# Patient Record
Sex: Female | Born: 1955 | Race: White | Hispanic: No | Marital: Married | Smoking: Never smoker
Health system: Southern US, Community
[De-identification: ages and names within clinical notes are randomized; demographics above are authoritative.]

## PROBLEM LIST (undated history)

## (undated) DIAGNOSIS — E785 Hyperlipidemia, unspecified: Secondary | ICD-10-CM

## (undated) DIAGNOSIS — R300 Dysuria: Secondary | ICD-10-CM

## (undated) DIAGNOSIS — R55 Syncope and collapse: Secondary | ICD-10-CM

## (undated) DIAGNOSIS — F32A Depression, unspecified: Secondary | ICD-10-CM

## (undated) DIAGNOSIS — R251 Tremor, unspecified: Secondary | ICD-10-CM

## (undated) DIAGNOSIS — F329 Major depressive disorder, single episode, unspecified: Secondary | ICD-10-CM

## (undated) DIAGNOSIS — I951 Orthostatic hypotension: Secondary | ICD-10-CM

## (undated) DIAGNOSIS — Z862 Personal history of diseases of the blood and blood-forming organs and certain disorders involving the immune mechanism: Secondary | ICD-10-CM

## (undated) DIAGNOSIS — C801 Malignant (primary) neoplasm, unspecified: Secondary | ICD-10-CM

## (undated) DIAGNOSIS — Z8619 Personal history of other infectious and parasitic diseases: Secondary | ICD-10-CM

## (undated) DIAGNOSIS — I10 Essential (primary) hypertension: Secondary | ICD-10-CM

## (undated) DIAGNOSIS — R9431 Abnormal electrocardiogram [ECG] [EKG]: Secondary | ICD-10-CM

## (undated) HISTORY — PX: MELANOMA EXCISION: SHX5266

## (undated) HISTORY — DX: Abnormal electrocardiogram (ECG) (EKG): R94.31

## (undated) HISTORY — DX: Tremor, unspecified: R25.1

## (undated) HISTORY — DX: Hyperlipidemia, unspecified: E78.5

## (undated) HISTORY — DX: Personal history of diseases of the blood and blood-forming organs and certain disorders involving the immune mechanism: Z86.2

## (undated) HISTORY — DX: Essential (primary) hypertension: I10

## (undated) HISTORY — DX: Syncope and collapse: R55

## (undated) HISTORY — DX: Depression, unspecified: F32.A

## (undated) HISTORY — DX: Dysuria: R30.0

## (undated) HISTORY — PX: OTHER SURGICAL HISTORY: SHX169

## (undated) HISTORY — PX: GALLBLADDER SURGERY: SHX652

## (undated) HISTORY — DX: Major depressive disorder, single episode, unspecified: F32.9

## (undated) HISTORY — DX: Orthostatic hypotension: I95.1

## (undated) HISTORY — PX: OVARIAN CYST REMOVAL: SHX89

## (undated) HISTORY — DX: Personal history of other infectious and parasitic diseases: Z86.19

## (undated) HISTORY — PX: COLONOSCOPY: SHX174

## (undated) HISTORY — PX: LEG SURGERY: SHX1003

---

## 2012-03-15 DIAGNOSIS — L821 Other seborrheic keratosis: Secondary | ICD-10-CM | POA: Insufficient documentation

## 2012-03-15 DIAGNOSIS — L738 Other specified follicular disorders: Secondary | ICD-10-CM | POA: Insufficient documentation

## 2012-03-15 DIAGNOSIS — D239 Other benign neoplasm of skin, unspecified: Secondary | ICD-10-CM | POA: Insufficient documentation

## 2012-03-15 DIAGNOSIS — L980 Pyogenic granuloma: Secondary | ICD-10-CM | POA: Insufficient documentation

## 2012-04-05 DIAGNOSIS — Z8781 Personal history of (healed) traumatic fracture: Secondary | ICD-10-CM | POA: Insufficient documentation

## 2012-05-18 DIAGNOSIS — M25373 Other instability, unspecified ankle: Secondary | ICD-10-CM | POA: Insufficient documentation

## 2012-05-18 DIAGNOSIS — M25673 Stiffness of unspecified ankle, not elsewhere classified: Secondary | ICD-10-CM | POA: Insufficient documentation

## 2012-05-18 DIAGNOSIS — M7750 Other enthesopathy of unspecified foot: Secondary | ICD-10-CM | POA: Insufficient documentation

## 2013-08-05 DIAGNOSIS — M722 Plantar fascial fibromatosis: Secondary | ICD-10-CM | POA: Insufficient documentation

## 2013-10-26 DIAGNOSIS — G250 Essential tremor: Secondary | ICD-10-CM | POA: Insufficient documentation

## 2013-10-26 DIAGNOSIS — E162 Hypoglycemia, unspecified: Secondary | ICD-10-CM | POA: Insufficient documentation

## 2014-09-27 DIAGNOSIS — M25572 Pain in left ankle and joints of left foot: Secondary | ICD-10-CM | POA: Insufficient documentation

## 2014-09-27 DIAGNOSIS — G8929 Other chronic pain: Secondary | ICD-10-CM | POA: Insufficient documentation

## 2015-04-07 DIAGNOSIS — R2231 Localized swelling, mass and lump, right upper limb: Secondary | ICD-10-CM | POA: Insufficient documentation

## 2015-04-07 DIAGNOSIS — W5501XA Bitten by cat, initial encounter: Secondary | ICD-10-CM | POA: Insufficient documentation

## 2015-04-07 DIAGNOSIS — S61259A Open bite of unspecified finger without damage to nail, initial encounter: Secondary | ICD-10-CM | POA: Insufficient documentation

## 2016-06-09 DIAGNOSIS — G4752 REM sleep behavior disorder: Secondary | ICD-10-CM | POA: Insufficient documentation

## 2016-06-09 DIAGNOSIS — G4733 Obstructive sleep apnea (adult) (pediatric): Secondary | ICD-10-CM | POA: Insufficient documentation

## 2016-06-09 DIAGNOSIS — G8929 Other chronic pain: Secondary | ICD-10-CM | POA: Insufficient documentation

## 2016-07-07 DIAGNOSIS — M67911 Unspecified disorder of synovium and tendon, right shoulder: Secondary | ICD-10-CM | POA: Insufficient documentation

## 2018-02-11 ENCOUNTER — Encounter: Payer: Self-pay | Admitting: Obstetrics and Gynecology

## 2018-02-11 ENCOUNTER — Ambulatory Visit (INDEPENDENT_AMBULATORY_CARE_PROVIDER_SITE_OTHER): Payer: Medicare HMO | Admitting: Obstetrics and Gynecology

## 2018-02-11 VITALS — BP 131/75 | HR 49 | Ht 65.0 in | Wt 174.4 lb

## 2018-02-11 DIAGNOSIS — N952 Postmenopausal atrophic vaginitis: Secondary | ICD-10-CM | POA: Insufficient documentation

## 2018-02-11 DIAGNOSIS — Z7689 Persons encountering health services in other specified circumstances: Secondary | ICD-10-CM | POA: Diagnosis not present

## 2018-02-11 DIAGNOSIS — Z8619 Personal history of other infectious and parasitic diseases: Secondary | ICD-10-CM

## 2018-02-11 HISTORY — DX: Personal history of other infectious and parasitic diseases: Z86.19

## 2018-02-11 MED ORDER — ESTROGENS, CONJUGATED 0.625 MG/GM VA CREA: 1.0000 | TOPICAL_CREAM | VAGINAL | 4 refills | Status: DC

## 2018-02-11 NOTE — Progress Notes (Signed)
GYNECOLOGY PROGRESS NOTE  Subjective:    Patient ID: Julie Lewis, female    DOB: 05/21/1956, 62 y.o.   MRN: [id number]  HPI  Patient is a 62 y.o. G0P0 female who presents for complaints of vaginal irritation and burning for approximately 2 weeks.  She notes that this has been ongoing for several weeks.  Initially was only burning and itching with urination, however now just feels sore, like pressure. She is not sexually active.  She denies vaginal discharge or odor.  She does report that she has a h/o HSV, but notes that this feels different from an outbreak. She tried Monistat last week which helped some for a few days but symptoms returned.   Of note, patient has relocated from [place] ~ 1 year ago, and has not had an annual exam since that time.  Notes that she would like to establish care at [facility].    Past Medical History:  Diagnosis Date  . Depression   . History of herpes genitalis 02/11/2018   Family History  Problem Relation Age of Onset  . Heart failure Mother   . Diabetes Mother   . Hypertension Father   . Hypertension Brother     Past Surgical History:  Procedure Laterality Date  . GALLBLADDER SURGERY    . OVARIAN CYST REMOVAL      Social History   Socioeconomic History  . Marital status: Married    Spouse name: Not on file  . Number of children: Not on file  . Years of education: Not on file  . Highest education level: Not on file  Social Needs  . Financial resource strain: Not on file  . Food insecurity - worry: Not on file  . Food insecurity - inability: Not on file  . Transportation needs - medical: Not on file  . Transportation needs - non-medical: Not on file  Occupational History  . Not on file  Tobacco Use  . Smoking status: Never Smoker  . Smokeless tobacco: Never Used  Substance and Sexual Activity  . Alcohol use: Yes    Comment: occas  . Drug use: No  . Sexual activity: No    Birth control/protection: Post-menopausal  Other  Topics Concern  . Not on file  Social History Narrative  . Not on file    Current Outpatient Medications on File Prior to Visit  Medication Sig Dispense Refill  . atorvastatin (LIPITOR) 40 MG tablet Take 40 mg by mouth daily.    . citalopram (CELEXA) 10 MG tablet Take 10 mg by mouth daily.    . propranolol ER (INDERAL LA) 120 MG 24 hr capsule Take 120 mg by mouth daily.    . valACYclovir (VALTREX) 500 MG tablet Take 500 mg by mouth 2 (two) times daily.     No current facility-administered medications on file prior to visit.     Allergies  Allergen Reactions  . Ivp Dye [Iodinated Diagnostic Agents]   . Wellbutrin [Bupropion] Other (See Comments)  . Penicillins Rash      Review of Systems Pertinent items noted in HPI and remainder of comprehensive ROS otherwise negative.   Objective:   Blood pressure 131/75, pulse (!) 49, height 5\' 5"  (1.651 m), weight 174 lb 6.4 oz (79.1 kg). General appearance: alert and no distress Abdomen: soft, non-tender; bowel sounds normal; no masses,  no organomegaly Pelvic: external genitalia normal, rectovaginal septum normal.  Vagina with small amount of thin white discharge, no odor.  Mild tenderness to palpation  at posterior fourchette and base of introitus. Mild atrophy noted of vaginal mucosa.  Cervix normal appearing, no lesions and no motion tenderness.  Uterus mobile, nontender, normal shape and size.  Adnexae non-palpable, nontender bilaterally.  Extremities: extremities normal, atraumatic, no cyanosis or edema Neurologic: Grossly normal    Labs:  Microscopic wet-mount exam shows negative for pathogens, normal epithelial cells.  KOH done, no pathogens.   Assessment:   Vaginal atrophy Establish care  Plan:   - Wet prep negative, no evidence of vaginitis. Diagnosis of vaginal atrophy given. Discussed management of vaginal atrophy with hormonal vs non-hormonal local therapy.  Discussed risks and benefits of both.  Patient desires to try  hormonal therapy.  Will prescribe Premarin cream to apply 2-3 times weekly. Samples given in office today.  - Patient desires to establish care.  Will f/u in 1 month for annual exam, will reassess symptoms at that time.   Rubie Maid, MD [facility]

## 2018-02-11 NOTE — Progress Notes (Signed)
Pt has burring and itching in the vaginal area for about 2x weeks. Pt tried monstatin 1 day but it did clear it up.  Pt haven't changed soaps or laundry detergent

## 2018-07-06 ENCOUNTER — Encounter: Payer: Self-pay | Admitting: Obstetrics and Gynecology

## 2018-07-06 ENCOUNTER — Ambulatory Visit: Payer: Medicare HMO | Admitting: Obstetrics and Gynecology

## 2018-07-06 VITALS — BP 99/62 | HR 56 | Ht 65.0 in | Wt 170.2 lb

## 2018-07-06 DIAGNOSIS — Z1231 Encounter for screening mammogram for malignant neoplasm of breast: Secondary | ICD-10-CM | POA: Diagnosis not present

## 2018-07-06 DIAGNOSIS — N952 Postmenopausal atrophic vaginitis: Secondary | ICD-10-CM | POA: Diagnosis not present

## 2018-07-06 NOTE — Progress Notes (Signed)
Pt stated that she is doing a lot better.

## 2018-07-06 NOTE — Progress Notes (Signed)
    GYNECOLOGY PROGRESS NOTE  Subjective:    Patient ID: Julie Lewis, female    DOB: [birth year], 62 y.o.   MRN: [id number]  HPI  Patient is a 62 y.o. G0P0000 female who presents for f/u of vaginal atrophy.  She was initiated on Premarin cream last visit (given samples 4 weeks ago). Patient notes that she used the samples for 2-3 weeks and her symptoms have greatly improved, however she was not able to fill the prescription for the cream due to cost. Notes that she is still currently asymptomatic.   Patient reports that she needs to be scheduled for an annual exam as she has not had one I several years.  The following portions of the patient's history were reviewed and updated as appropriate: allergies, current medications, past family history, past medical history, past social history, past surgical history and problem list.  Review of Systems Pertinent items noted in HPI and remainder of comprehensive ROS otherwise negative.   Objective:   Blood pressure 99/62, pulse (!) 56, height 5\' 5"  (1.651 m), weight 170 lb 3.2 oz (77.2 kg). General appearance: alert and no distress Abdomen: soft, non-tender; bowel sounds normal; no masses,  no organomegaly Pelvic: exam deferred.    Assessment:   Vaginal atrophy Need for mammogram  Plan:  1. Vaginal atrophy - patient noted improvement in symptoms with use of Premarin.  Given samples again today along with coupon card.  Patient notes that she will use if she becomes symptomatic again.  2. Patient notes need for annual gynecologic care. Will schedule to f/u in 1 month for annual exam.    Rubie Maid, MD [facility]

## 2018-08-17 ENCOUNTER — Encounter: Payer: Self-pay | Admitting: Obstetrics and Gynecology

## 2018-08-17 ENCOUNTER — Other Ambulatory Visit (HOSPITAL_COMMUNITY)
Admission: RE | Admit: 2018-08-17 | Discharge: 2018-08-17 | Disposition: A | Discharge status: Home / Self Care | Payer: Medicare HMO | Source: Ambulatory Visit | Attending: Obstetrics and Gynecology | Admitting: Obstetrics and Gynecology

## 2018-08-17 ENCOUNTER — Ambulatory Visit (INDEPENDENT_AMBULATORY_CARE_PROVIDER_SITE_OTHER): Payer: Medicare HMO | Admitting: Obstetrics and Gynecology

## 2018-08-17 VITALS — BP 120/69 | HR 56 | Ht 65.0 in | Wt 171.5 lb

## 2018-08-17 DIAGNOSIS — Z124 Encounter for screening for malignant neoplasm of cervix: Secondary | ICD-10-CM

## 2018-08-17 DIAGNOSIS — R928 Other abnormal and inconclusive findings on diagnostic imaging of breast: Secondary | ICD-10-CM

## 2018-08-17 DIAGNOSIS — N952 Postmenopausal atrophic vaginitis: Secondary | ICD-10-CM | POA: Insufficient documentation

## 2018-08-17 DIAGNOSIS — Z1151 Encounter for screening for human papillomavirus (HPV): Secondary | ICD-10-CM | POA: Diagnosis not present

## 2018-08-17 DIAGNOSIS — Z6828 Body mass index (BMI) 28.0-28.9, adult: Secondary | ICD-10-CM | POA: Insufficient documentation

## 2018-08-17 DIAGNOSIS — Z1239 Encounter for other screening for malignant neoplasm of breast: Secondary | ICD-10-CM

## 2018-08-17 DIAGNOSIS — L989 Disorder of the skin and subcutaneous tissue, unspecified: Secondary | ICD-10-CM

## 2018-08-17 DIAGNOSIS — Z01419 Encounter for gynecological examination (general) (routine) without abnormal findings: Secondary | ICD-10-CM

## 2018-08-17 DIAGNOSIS — Z1211 Encounter for screening for malignant neoplasm of colon: Secondary | ICD-10-CM

## 2018-08-17 DIAGNOSIS — E663 Overweight: Secondary | ICD-10-CM | POA: Insufficient documentation

## 2018-08-17 DIAGNOSIS — Z1231 Encounter for screening mammogram for malignant neoplasm of breast: Secondary | ICD-10-CM

## 2018-08-17 NOTE — Progress Notes (Signed)
PT is present today for her annual exam. Pt stated that she is doing well no complaints.   

## 2018-08-17 NOTE — Progress Notes (Signed)
ANNUAL PREVENTATIVE CARE GYNECOLOGY  ENCOUNTER NOTE  Subjective:       Julie Lewis is a 62 y.o. G0P0000 female here for a routine annual gynecologic exam. The patient is not sexually active. The patient is not taking hormone replacement therapy (however she does take Premarin cream locally as needed for vaginal atrophy). Patient denies post-menopausal vaginal bleeding. The patient wears seatbelts: yes. The patient participates in regular exercise: no. Has the patient ever been transfused or tattooed?: no. The patient reports that there is not domestic violence in her life.  Current complaints: 1.  Notes skin lesion near mouth x 3 months, not relieved with Valtrex, Abreva recommended by her Dermatologist but not helping.    Gynecologic History No LMP recorded. Patient is postmenopausal. Contraception: post menopausal status Last Pap: "several years ago". Results were: normal. Does report remote history of abnormal pap smears x 2 in the past, requiring laser ablation.  Last mammogram: "several years ago". Results were: normal Last Colonoscopy: Unsure of how many years ago, but has had one in the past.  Last Dexa Scan: Never had one PCP - Dr. Denita [facility] in [place])  Obstetric History OB History  Gravida Para Term Preterm AB Living  0 0 0 0 0 0  SAB TAB Ectopic Multiple Live Births  0 0 0 0 0    Past Medical History:  Diagnosis Date  . Depression   . History of herpes genitalis 02/11/2018    Family History  Problem Relation Age of Onset  . Heart failure Mother   . Diabetes Mother   . Hypertension Father   . Hypertension Brother     Past Surgical History:  Procedure Laterality Date  . finger surgica;    . GALLBLADDER SURGERY    . hemmorriod sugery    . LEG SURGERY    . OVARIAN CYST REMOVAL      Social History   Socioeconomic History  . Marital status: Married    Spouse name: Not on file  . Number of children: Not on file  .  Years of education: Not on file  . Highest education level: Not on file  Occupational History  . Not on file  Social Needs  . Financial resource strain: Not on file  . Food insecurity:    Worry: Not on file    Inability: Not on file  . Transportation needs:    Medical: Not on file    Non-medical: Not on file  Tobacco Use  . Smoking status: Never Smoker  . Smokeless tobacco: Never Used  Substance and Sexual Activity  . Alcohol use: Yes    Comment: occas  . Drug use: No  . Sexual activity: Never    Birth control/protection: Post-menopausal  Lifestyle  . Physical activity:    Days per week: Not on file    Minutes per session: Not on file  . Stress: Not on file  Relationships  . Social connections:    Talks on phone: Not on file    Gets together: Not on file    Attends religious service: Not on file    Active member of club or organization: Not on file    Attends meetings of clubs or organizations: Not on file    Relationship status: Not on file  . Intimate partner violence:    Fear of current or ex partner: Not on file    Emotionally abused: Not on file    Physically abused: Not on file  Forced sexual activity: Not on file  Other Topics Concern  . Not on file  Social History Narrative  . Not on file    Current Outpatient Medications on File Prior to Visit  Medication Sig Dispense Refill  . atorvastatin (LIPITOR) 40 MG tablet Take 40 mg by mouth daily.    . citalopram (CELEXA) 10 MG tablet Take 10 mg by mouth daily.    . Flaxseed, Linseed, (FLAX SEED OIL) 1000 MG CAPS Take by mouth.    . gabapentin (NEURONTIN) 300 MG capsule Take 300 mg by mouth 3 (three) times daily.    . primidone (MYSOLINE) 250 MG tablet Take 250 mg by mouth daily.    . propranolol ER (INDERAL LA) 120 MG 24 hr capsule Take 120 mg by mouth daily.    . valACYclovir (VALTREX) 500 MG tablet Take 500 mg by mouth 2 (two) times daily.     No current facility-administered medications on file prior to  visit.     Allergies  Allergen Reactions  . Erythromycin     Severe stomach issues   . Ivp Dye [Iodinated Diagnostic Agents]   . Wellbutrin [Bupropion] Other (See Comments)  . Penicillins Rash      Review of Systems ROS Review of Systems - General ROS: negative for - chills, fatigue, fever, hot flashes, night sweats, weight gain or weight loss Psychological ROS: negative for - anxiety, decreased libido, depression, mood swings, physical abuse or sexual abuse Ophthalmic ROS: negative for - blurry vision, eye pain or loss of vision ENT ROS: negative for - headaches, hearing change, visual changes or vocal changes Allergy and Immunology ROS: negative for - hives, itchy/watery eyes or seasonal allergies Hematological and Lymphatic ROS: negative for - bleeding problems, bruising, swollen lymph nodes or weight loss Endocrine ROS: negative for - galactorrhea, hair pattern changes, hot flashes, malaise/lethargy, mood swings, palpitations, polydipsia/polyuria, skin changes, temperature intolerance or unexpected weight changes Breast ROS: negative for - new or changing breast lumps or nipple discharge Respiratory ROS: negative for - cough or shortness of breath Cardiovascular ROS: negative for - chest pain, irregular heartbeat, palpitations or shortness of breath Gastrointestinal ROS: no abdominal pain, change in bowel habits, or black or bloody stools Genito-Urinary ROS: no dysuria, trouble voiding, or hematuria Musculoskeletal ROS: negative for - joint pain or joint stiffness Neurological ROS: negative for - bowel and bladder control changes Dermatological ROS: negative for rash and skin changes.  Positive for skin lesion under bottom lip, persistent x 3 months, often peeling, irritation, and sometimes with weepy small bumps.    Objective:   BP 120/69   Pulse (!) 56   Ht 5\' 5"  (1.651 m)   Wt 171 lb 8 oz (77.8 kg)   BMI 28.54 kg/m  CONSTITUTIONAL: Well-developed, well-nourished female  in no acute distress. Overweight PSYCHIATRIC: Normal mood and affect. Normal behavior. Normal judgment and thought content. [place]: Alert and oriented to person, place, and time. Normal muscle tone coordination. No cranial nerve deficit noted. HENT:  Normocephalic, atraumatic, External right and left ear normal. Oropharynx is clear and moist EYES: Conjunctivae and EOM are normal. Pupils are equal, round, and reactive to light. No scleral icterus.  NECK: Normal range of motion, supple, no masses.  Normal thyroid.  SKIN: Skin is warm and dry. No rash noted. Not diaphoretic. No erythema. No pallor. Area beneath bottom lip slightly raised, mildly reddened. No peeling or open sores present.  CARDIOVASCULAR: Normal heart rate noted, regular rhythm, no murmur. RESPIRATORY: Clear to  auscultation bilaterally. Effort and breath sounds normal, no problems with respiration noted. BREASTS: Symmetric in size. No masses, skin changes, nipple drainage, or lymphadenopathy. ABDOMEN: Soft, normal bowel sounds, no distention noted.  No tenderness, rebound or guarding.  BLADDER: Normal PELVIC:  Bladder no bladder distension noted  Urethra: normal appearing urethra with no masses, tenderness or lesions  Vulva: normal appearing vulva with no masses, tenderness or lesions  Vagina: normal appearing vagina with normal color and discharge, no lesions  Cervix: normal appearing cervix without discharge or lesions  Uterus: uterus is normal size, shape, consistency and nontender  Adnexa: normal adnexa in size, nontender and no masses  RV: External Exam NormaI, No Rectal Masses and Normal Sphincter tone  MUSCULOSKELETAL: Normal range of motion. No tenderness.  No cyanosis, clubbing, or edema.  2+ distal pulses. LYMPHATIC: No Axillary, Supraclavicular, or Inguinal Adenopathy.   Labs: No results found for: WBC, HGB, HCT, MCV, PLT  No results found for: CREATININE, BUN, NA, K, CL, CO2  No results found for: ALT, AST,  GGT, ALKPHOS, BILITOT  No results found for: CHOL, HDL, LDLCALC, LDLDIRECT, TRIG, CHOLHDL  No results found for: TSH  No results found for: HGBA1C   Assessment:   Annual gynecologic examination 62 y.o. Contraception: post menopausal status Overweight  Mild vaginal atrophy Skin lesion  Plan:  Pap: Pap Co Test and Not done Mammogram: Ordered Stool Guaiac Testing:  Not Ordered.   Labs: None ordered. Patient has labs done by PCP  Dexa Scan: to be performed at age 57 Routine preventative health maintenance measures emphasized: Exercise/Diet/Weight control, Alcohol/Substance use risks and Stress Management Skin lesion still present after 3 months despite several different treatments. Advised patient to follow back up with Dermatologist.  Mild vaginal atrophy, continue Premarin cream as needed   Rubie Maid, MD [facility] 08/17/2018 9:37 PM  Return to [place]

## 2018-08-18 LAB — CYTOLOGY - PAP
Diagnosis: NEGATIVE
HPV (WINDOPATH): NOT DETECTED

## 2018-09-07 ENCOUNTER — Ambulatory Visit
Admission: RE | Admit: 2018-09-07 | Discharge: 2018-09-07 | Disposition: A | Discharge status: Home / Self Care | Payer: Medicare HMO | Source: Ambulatory Visit | Attending: Obstetrics and Gynecology | Admitting: Obstetrics and Gynecology

## 2018-09-07 DIAGNOSIS — Z1231 Encounter for screening mammogram for malignant neoplasm of breast: Secondary | ICD-10-CM | POA: Insufficient documentation

## 2018-09-07 DIAGNOSIS — Z1239 Encounter for other screening for malignant neoplasm of breast: Secondary | ICD-10-CM

## 2018-09-07 HISTORY — DX: Malignant (primary) neoplasm, unspecified: C80.1

## 2018-09-13 ENCOUNTER — Other Ambulatory Visit: Payer: Self-pay | Admitting: *Deleted

## 2018-09-13 ENCOUNTER — Inpatient Hospital Stay
Admission: RE | Admit: 2018-09-13 | Discharge: 2018-09-13 | Disposition: A | Discharge status: Home / Self Care | Payer: Self-pay | Source: Ambulatory Visit | Attending: *Deleted | Admitting: *Deleted

## 2018-09-13 DIAGNOSIS — Z9289 Personal history of other medical treatment: Secondary | ICD-10-CM

## 2018-09-14 ENCOUNTER — Other Ambulatory Visit: Payer: Self-pay | Admitting: Obstetrics and Gynecology

## 2018-09-14 DIAGNOSIS — N6489 Other specified disorders of breast: Secondary | ICD-10-CM

## 2018-09-14 DIAGNOSIS — R928 Other abnormal and inconclusive findings on diagnostic imaging of breast: Secondary | ICD-10-CM

## 2018-09-16 NOTE — Addendum Note (Signed)
Addended by: Augusto Gamble on: 09/16/2018 11:59 AM   Modules accepted: Orders

## 2018-09-23 ENCOUNTER — Ambulatory Visit
Admission: RE | Admit: 2018-09-23 | Discharge: 2018-09-23 | Disposition: A | Discharge status: Home / Self Care | Payer: Medicare HMO | Source: Ambulatory Visit | Attending: Obstetrics and Gynecology | Admitting: Obstetrics and Gynecology

## 2018-09-23 DIAGNOSIS — R928 Other abnormal and inconclusive findings on diagnostic imaging of breast: Secondary | ICD-10-CM

## 2018-09-23 DIAGNOSIS — N6489 Other specified disorders of breast: Secondary | ICD-10-CM | POA: Diagnosis present

## 2018-12-07 IMAGING — MG MM DIGITAL DIAGNOSTIC UNILAT*R* W/ TOMO W/ CAD
4 series · 4 of 12 positions shown · non-contrast
Comparison: Previous exam(s).

CLINICAL DATA: Patient recalled from screening for possible right
breast asymmetry.

EXAM:
DIGITAL DIAGNOSTIC UNILATERAL RIGHT MAMMOGRAM WITH CAD AND TOMO

[R ML synth-2D]
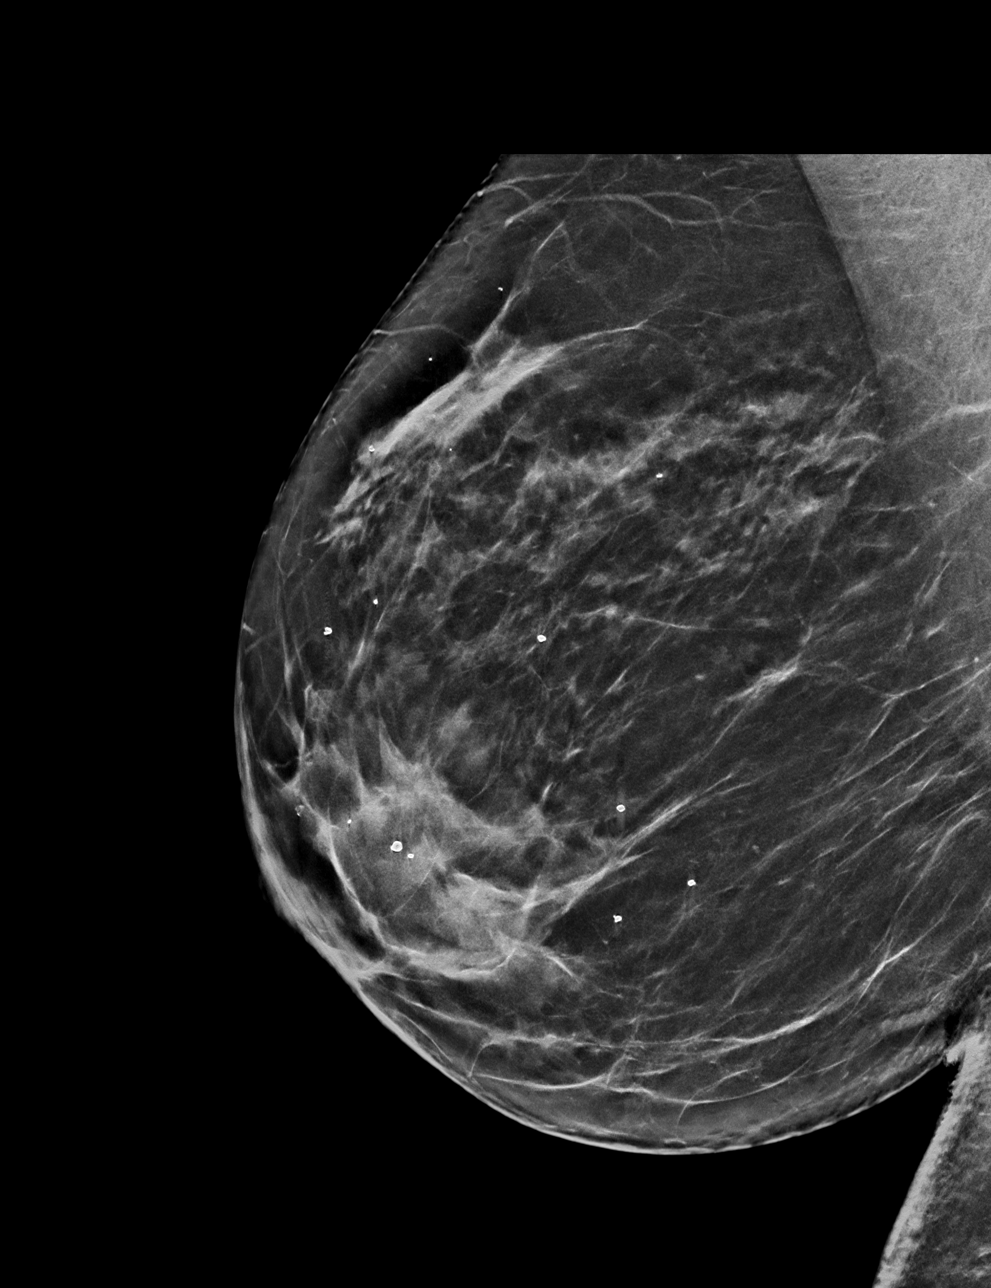

[R CC synth-2D]
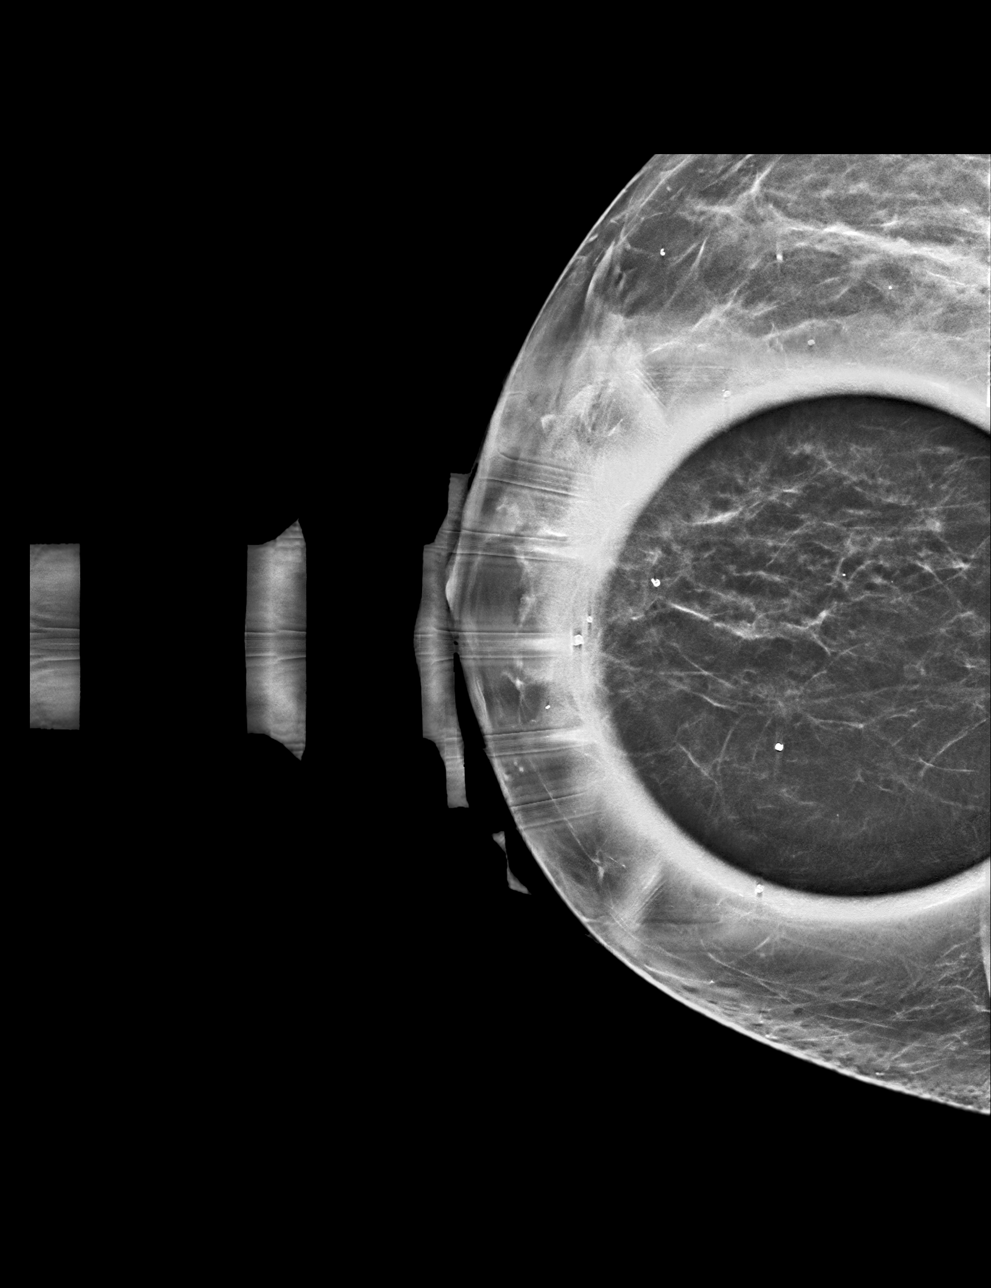

[R CC tomo · tomo slice 27/53.0]
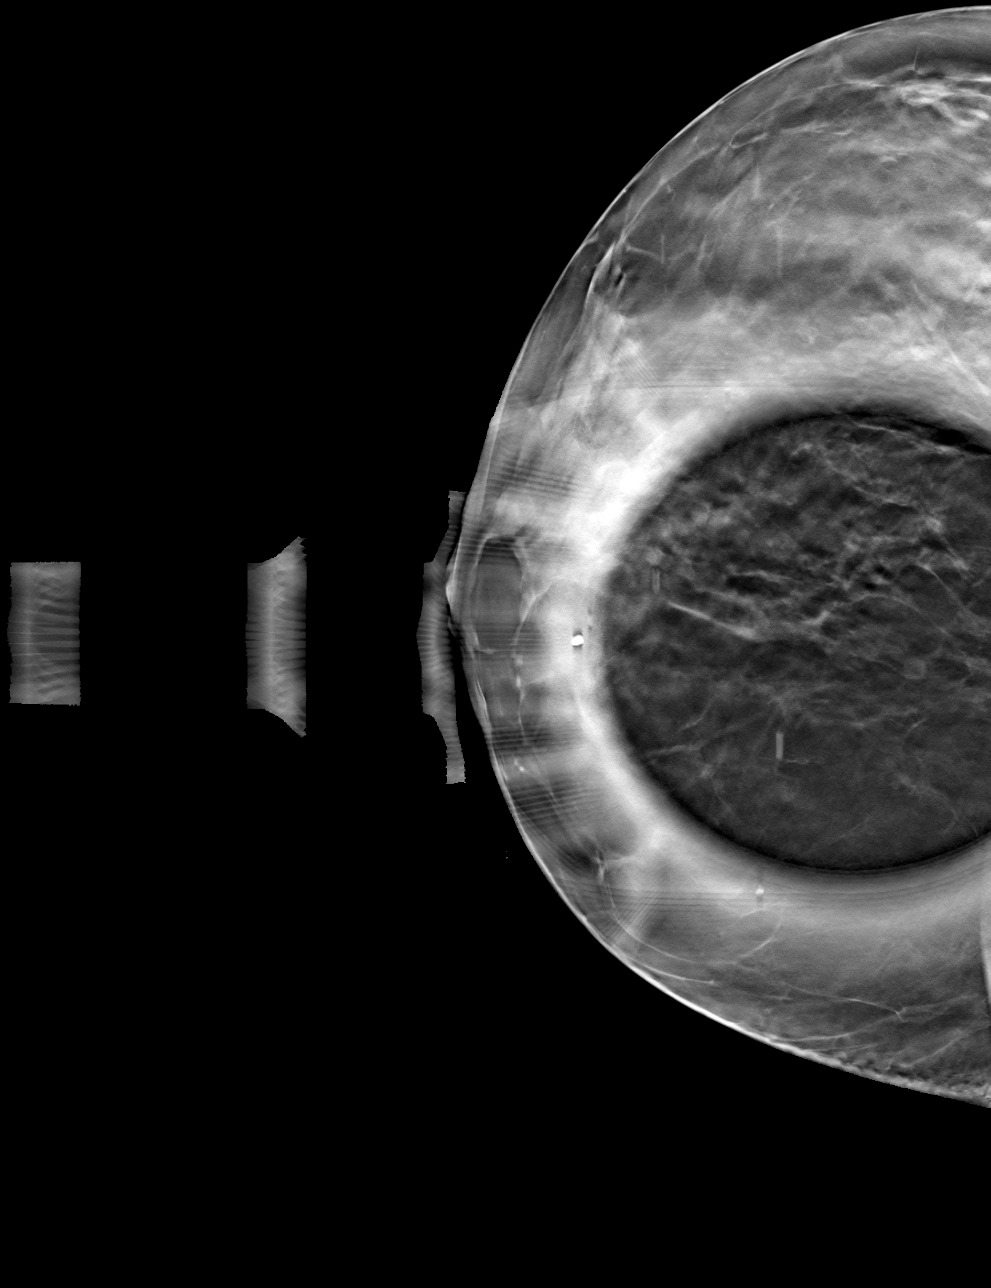

[R ML tomo · tomo slice 33/65.0]
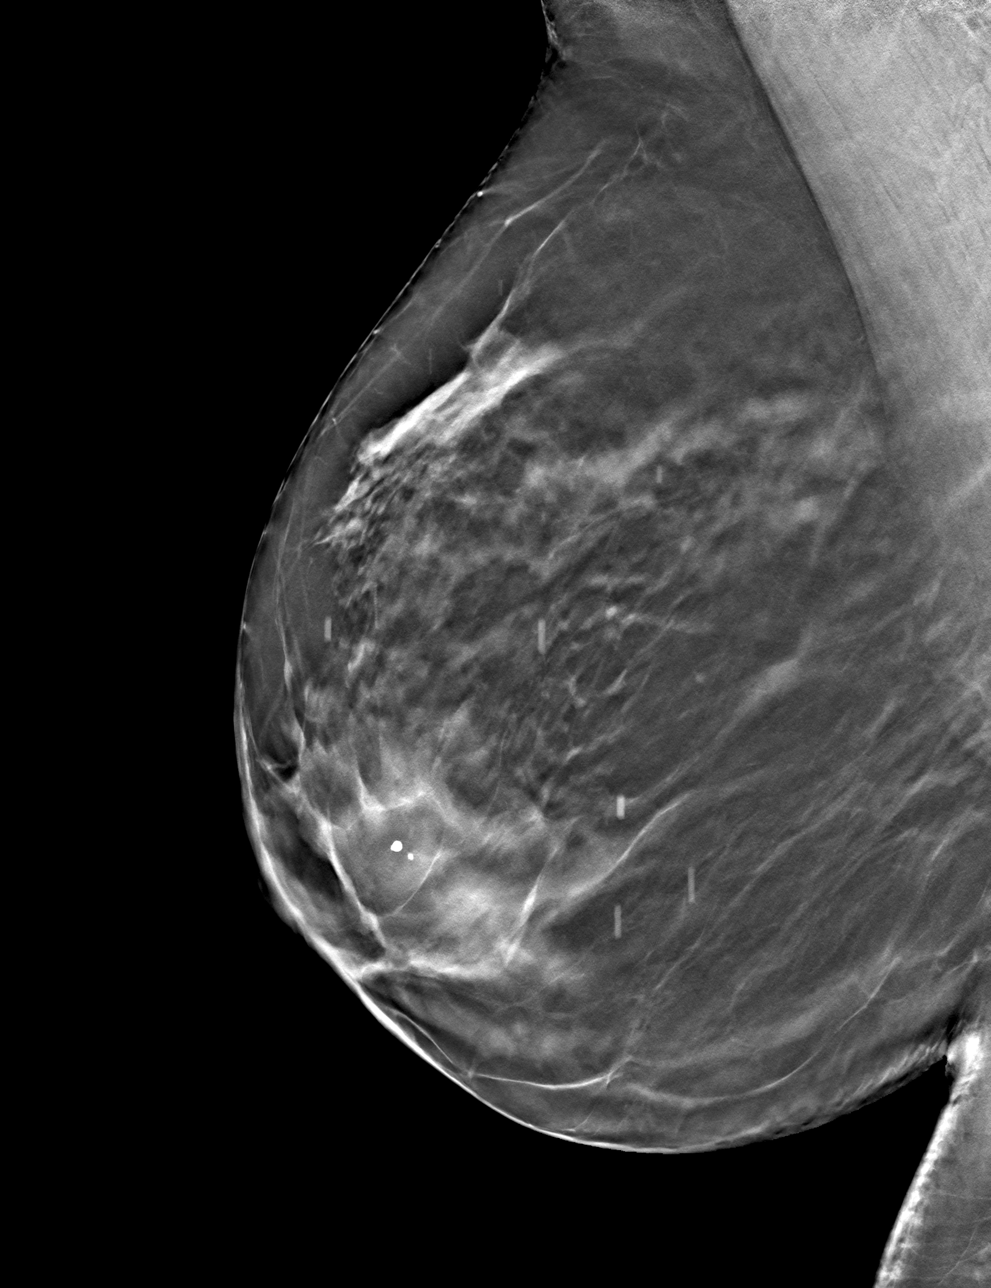

[4 of 12 positions shown; findings below may reference images not displayed]

ACR Breast Density Category c: The breast tissue is heterogeneously
dense, which may obscure small masses.
FINDINGS: Spot compression CC and full paddle true lateral views of the right
breast were obtained. Questioned asymmetry within the anteromedial
right breast demonstrated on the CC view resolved with additional
imaging, compatible with overlapping fibroglandular tissue. No
suspicious abnormality identified.

Mammographic images were processed with CAD.
IMPRESSION: No mammographic evidence for malignancy.

RECOMMENDATION:
Screening mammogram in one year.(Code:WP-5-4XS)

I have discussed the findings and recommendations with the patient.
Results were also provided in writing at the conclusion of the
visit. If applicable, a reminder letter will be sent to the patient
regarding the next appointment.

BI-RADS CATEGORY  1: Negative.

## 2019-08-10 ENCOUNTER — Other Ambulatory Visit: Payer: Self-pay | Admitting: Obstetrics and Gynecology

## 2019-08-30 ENCOUNTER — Encounter: Payer: Self-pay | Admitting: Obstetrics and Gynecology

## 2019-08-30 ENCOUNTER — Ambulatory Visit (INDEPENDENT_AMBULATORY_CARE_PROVIDER_SITE_OTHER): Payer: Medicare HMO | Admitting: Obstetrics and Gynecology

## 2019-08-30 ENCOUNTER — Other Ambulatory Visit: Payer: Self-pay

## 2019-08-30 VITALS — BP 148/85 | HR 52 | Ht 65.0 in | Wt 173.2 lb

## 2019-08-30 DIAGNOSIS — N952 Postmenopausal atrophic vaginitis: Secondary | ICD-10-CM

## 2019-08-30 DIAGNOSIS — B354 Tinea corporis: Secondary | ICD-10-CM

## 2019-08-30 DIAGNOSIS — E663 Overweight: Secondary | ICD-10-CM | POA: Diagnosis not present

## 2019-08-30 DIAGNOSIS — Z01419 Encounter for gynecological examination (general) (routine) without abnormal findings: Secondary | ICD-10-CM | POA: Diagnosis not present

## 2019-08-30 DIAGNOSIS — K5909 Other constipation: Secondary | ICD-10-CM

## 2019-08-30 DIAGNOSIS — Z1239 Encounter for other screening for malignant neoplasm of breast: Secondary | ICD-10-CM

## 2019-08-30 NOTE — Progress Notes (Signed)
ANNUAL PREVENTATIVE CARE GYNECOLOGY  ENCOUNTER NOTE  Subjective:       Julie Lewis is a 63 y.o. G0P0000 female here for a routine annual gynecologic exam. The patient is not sexually active. The patient is not taking hormone replacement therapy. Patient denies post-menopausal vaginal bleeding. The patient wears seatbelts: yes. The patient participates in regular exercise: no. Has the patient ever been transfused or tattooed?: no. The patient reports that there is not domestic violence in her life.  Current complaints: 1.  Notes issues with bowel movements, feels like she is passing "rabbit pellets".  This has been ongoing for "some time now".  2. Rash on arms has appeared over the past 2 weeks, initially on the right arm which is improving, now new one on the left. Thinks it may be ringworm or a spider bite. Has been using Lotrimin on the rash on the right arm.     Gynecologic History No LMP recorded. Patient is postmenopausal. Contraception: post menopausal status Last Pap: 08/17/2018. H/o abnormal pap x 2 in the past, requiring laser ablation.  Last mammogram: 09/23/2018.  Results are normal.  Last Colonoscopy: Unsure of date but thinks she is due in another 1-2 years.  Last Dexa Scan: Never had one PCP - Dr. Denita Lung [facility] in [place])  Obstetric History OB History  Gravida Para Term Preterm AB Living  0 0 0 0 0 0  SAB TAB Ectopic Multiple Live Births  0 0 0 0 0    Past Medical History:  Diagnosis Date  . Cancer ([place])    melanoma  . Depression   . History of herpes genitalis 02/11/2018    Family History  Problem Relation Age of Onset  . Heart failure Mother   . Diabetes Mother   . Hypertension Father   . Hypertension Brother   . Breast cancer Cousin     Past Surgical History:  Procedure Laterality Date  . finger surgica;    . GALLBLADDER SURGERY    . hemmorriod sugery    . LEG SURGERY    . OVARIAN CYST REMOVAL      Social History    Socioeconomic History  . Marital status: Married    Spouse name: Not on file  . Number of children: Not on file  . Years of education: Not on file  . Highest education level: Not on file  Occupational History  . Not on file  Social Needs  . Financial resource strain: Not on file  . Food insecurity    Worry: Not on file    Inability: Not on file  . Transportation needs    Medical: Not on file    Non-medical: Not on file  Tobacco Use  . Smoking status: Never Smoker  . Smokeless tobacco: Never Used  Substance and Sexual Activity  . Alcohol use: Yes    Comment: occas  . Drug use: No  . Sexual activity: Never    Birth control/protection: Post-menopausal  Lifestyle  . Physical activity    Days per week: Not on file    Minutes per session: Not on file  . Stress: Not on file  Relationships  . Social Herbalist on phone: Not on file    Gets together: Not on file    Attends religious service: Not on file    Active member of club or organization: Not on file    Attends meetings of clubs or organizations: Not on file  Relationship status: Not on file  . Intimate partner violence    Fear of current or ex partner: Not on file    Emotionally abused: Not on file    Physically abused: Not on file    Forced sexual activity: Not on file  Other Topics Concern  . Not on file  Social History Narrative  . Not on file    Current Outpatient Medications on File Prior to Visit  Medication Sig Dispense Refill  . atorvastatin (LIPITOR) 40 MG tablet Take 40 mg by mouth daily.    . citalopram (CELEXA) 10 MG tablet Take 10 mg by mouth daily.    . Flaxseed, Linseed, (FLAX SEED OIL) 1000 MG CAPS Take by mouth.    . gabapentin (NEURONTIN) 300 MG capsule Take 300 mg by mouth 3 (three) times daily.    . MELOXICAM PO Take by mouth.    . primidone (MYSOLINE) 250 MG tablet Take 250 mg by mouth daily.    . propranolol ER (INDERAL LA) 120 MG 24 hr capsule Take 120 mg by mouth daily.     . TRAMADOL HCL PO Take by mouth.    Marland Kitchen UNABLE TO FIND Vit B 17    . valACYclovir (VALTREX) 500 MG tablet Take 500 mg by mouth 2 (two) times daily.     No current facility-administered medications on file prior to visit.     Allergies  Allergen Reactions  . Erythromycin     Severe stomach issues   . Ivp Dye [Iodinated Diagnostic Agents]   . Wellbutrin [Bupropion] Other (See Comments)  . Penicillins Rash      Review of Systems ROS Review of Systems - General ROS: negative for - chills, fatigue, fever, hot flashes, night sweats, weight gain or weight loss Psychological ROS: negative for - anxiety, decreased libido, depression, mood swings, physical abuse or sexual abuse Ophthalmic ROS: negative for - blurry vision, eye pain or loss of vision ENT ROS: negative for - headaches, hearing change, visual changes or vocal changes Allergy and Immunology ROS: negative for - hives, itchy/watery eyes or seasonal allergies Hematological and Lymphatic ROS: negative for - bleeding problems, bruising, swollen lymph nodes or weight loss Endocrine ROS: negative for - galactorrhea, hair pattern changes, hot flashes, malaise/lethargy, mood swings, palpitations, polydipsia/polyuria, skin changes, temperature intolerance or unexpected weight changes Breast ROS: negative for - new or changing breast lumps or nipple discharge Respiratory ROS: negative for - cough or shortness of breath Cardiovascular ROS: negative for - chest pain, irregular heartbeat, palpitations or shortness of breath Gastrointestinal ROS: no abdominal pain, change in bowel habits, or black or bloody stools Genito-Urinary ROS: no dysuria, trouble voiding, or hematuria Musculoskeletal ROS: negative for - joint pain or joint stiffness Neurological ROS: negative for - bowel and bladder control changes Dermatological ROS: negative for rash and skin changes.  Positive for bilateral circular rash on forearms (see HPI).    Objective:   BP  (!) 148/85   Pulse (!) 52   Ht 5\' 5"  (1.651 m)   Wt 173 lb 3.2 oz (78.6 kg)   BMI 28.82 kg/m  CONSTITUTIONAL: Well-developed, well-nourished female in no acute distress. Overweight PSYCHIATRIC: Normal mood and affect. Normal behavior. Normal judgment and thought content. [place]: Alert and oriented to person, place, and time. Normal muscle tone coordination. No cranial nerve deficit noted. HENT:  Normocephalic, atraumatic, External right and left ear normal. Oropharynx is clear and moist EYES: Conjunctivae and EOM are normal. Pupils are equal, round, and reactive  to light. No scleral icterus.  NECK: Normal range of motion, supple, no masses.  Normal thyroid.  SKIN: Skin is warm and dry. No rash noted. Not diaphoretic. No erythema. No pallor. Circular rash on bilateral forearms, ~ 2 x 2 cm, left with scaling patch, right with few small papules.  CARDIOVASCULAR: Normal heart rate noted, regular rhythm, no murmur. RESPIRATORY: Clear to auscultation bilaterally. Effort and breath sounds normal, no problems with respiration noted. BREASTS: Symmetric in size. No masses, skin changes, nipple drainage, or lymphadenopathy. ABDOMEN: Soft, normal bowel sounds, no distention noted.  No tenderness, rebound or guarding.  BLADDER: Normal PELVIC:  Bladder no bladder distension noted  Urethra: normal appearing urethra with no masses, tenderness or lesions  Vulva: normal appearing vulva with no masses, tenderness or lesions  Vagina: normal appearing vagina with normal color and discharge, no lesions  Cervix: normal appearing cervix without discharge or lesions  Uterus: uterus is normal size, shape, consistency and nontender  Adnexa: normal adnexa in size, nontender and no masses  RV: External Exam NormaI, No Rectal Masses and Normal Sphincter tone  MUSCULOSKELETAL: Normal range of motion. No tenderness.  No cyanosis, clubbing, or edema.  2+ distal pulses. LYMPHATIC: No Axillary, Supraclavicular, or  Inguinal Adenopathy.   Labs: Performed by PCP in [place] last week.   Assessment:   Annual gynecologic examination 63 y.o. Contraception: post menopausal status Overweight  Mild vaginal atrophy Skin lesion (ringworm) Constipation HTN  Plan:  - Pap: Not done. Patient is up to date. Next pap smear in 2022. - Mammogram: Ordered - Stool Guaiac Testing:  Not Ordered.  Not due for colonoscopy until 1-2 years.  - Labs: None ordered. Patient has labs done by PCP  - Dexa Scan: to be performed at age 55 - Routine preventative health maintenance measures emphasized: Exercise/Diet/Weight control, Alcohol/Substance use risks and Stress Management  - HTN managed by PCP.  - Mild vaginal atrophy, discontinued Premarin cream, has not had any major symptoms. Can treat as needed.  For ringworm, to continue Lotrimin to new area, discussed prevention measures.  - Constipation noted, patient admits that she does not hydrate as she should. Encouraged adequate water intake, increasing fiber in diet, using prune/apple juice mix.  If still no relief can discuss medications.  - Return to clinic in 1 year for annual exam.     Rubie Maid, MD [facility] 08/30/2019 3:40 PM

## 2019-08-30 NOTE — Progress Notes (Signed)
Pt is present for annual exam. Pt stated that she is doing well no problems.  

## 2019-08-30 NOTE — Patient Instructions (Addendum)
Preventive Care 40-64 Years Old, Female Preventive care refers to visits with your health care provider and lifestyle choices that can promote health and wellness. This includes:  A yearly physical exam. This may also be called an annual well check.  Regular dental visits and eye exams.  Immunizations.  Screening for certain conditions.  Healthy lifestyle choices, such as eating a healthy diet, getting regular exercise, not using drugs or products that contain nicotine and tobacco, and limiting alcohol use. What can I expect for my preventive care visit? Physical exam Your health care provider will check your:  Height and weight. This may be used to calculate body mass index (BMI), which tells if you are at a healthy weight.  Heart rate and blood pressure.  Skin for abnormal spots. Counseling Your health care provider may ask you questions about your:  Alcohol, tobacco, and drug use.  Emotional well-being.  Home and relationship well-being.  Sexual activity.  Eating habits.  Work and work environment.  Method of birth control.  Menstrual cycle.  Pregnancy history. What immunizations do I need?  Influenza (flu) vaccine  This is recommended every year. Tetanus, diphtheria, and pertussis (Tdap) vaccine  You may need a Td booster every 10 years. Varicella (chickenpox) vaccine  You may need this if you have not been vaccinated. Zoster (shingles) vaccine  You may need this after age 60. Measles, mumps, and rubella (MMR) vaccine  You may need at least one dose of MMR if you were born in 1957 or later. You may also need a second dose. Pneumococcal conjugate (PCV13) vaccine  You may need this if you have certain conditions and were not previously vaccinated. Pneumococcal polysaccharide (PPSV23) vaccine  You may need one or two doses if you smoke cigarettes or if you have certain conditions. Meningococcal conjugate (MenACWY) vaccine  You may need this if you  have certain conditions. Hepatitis A vaccine  You may need this if you have certain conditions or if you travel or work in places where you may be exposed to hepatitis A. Hepatitis B vaccine  You may need this if you have certain conditions or if you travel or work in places where you may be exposed to hepatitis B. Haemophilus influenzae type b (Hib) vaccine  You may need this if you have certain conditions. Human papillomavirus (HPV) vaccine  If recommended by your health care provider, you may need three doses over 6 months. You may receive vaccines as individual doses or as more than one vaccine together in one shot (combination vaccines). Talk with your health care provider about the risks and benefits of combination vaccines. What tests do I need? Blood tests  Lipid and cholesterol levels. These may be checked every 5 years, or more frequently if you are over 50 years old.  Hepatitis C test.  Hepatitis B test. Screening  Lung cancer screening. You may have this screening every year starting at age 55 if you have a 30-pack-year history of smoking and currently smoke or have quit within the past 15 years.  Colorectal cancer screening. All adults should have this screening starting at age 50 and continuing until age 75. Your health care provider may recommend screening at age 45 if you are at increased risk. You will have tests every 1-10 years, depending on your results and the type of screening test.  Diabetes screening. This is done by checking your blood sugar (glucose) after you have not eaten for a while (fasting). You may have this   done every 1-3 years.  Mammogram. This may be done every 1-2 years. Talk with your health care provider about when you should start having regular mammograms. This may depend on whether you have a family history of breast cancer.  BRCA-related cancer screening. This may be done if you have a family history of breast, ovarian, tubal, or peritoneal  cancers.  Pelvic exam and Pap test. This may be done every 3 years starting at age 21. Starting at age 30, this may be done every 5 years if you have a Pap test in combination with an HPV test. Other tests  Sexually transmitted disease (STD) testing.  Bone density scan. This is done to screen for osteoporosis. You may have this scan if you are at high risk for osteoporosis. Follow these instructions at home: Eating and drinking  Eat a diet that includes fresh fruits and vegetables, whole grains, lean protein, and low-fat dairy.  Take vitamin and mineral supplements as recommended by your health care provider.  Do not drink alcohol if: ? Your health care provider tells you not to drink. ? You are pregnant, may be pregnant, or are planning to become pregnant.  If you drink alcohol: ? Limit how much you have to 0-1 drink a day. ? Be aware of how much alcohol is in your drink. In the [place] one drink equals one 12 oz bottle of beer (355 mL), one 5 oz glass of wine (148 mL), or one 1 oz glass of hard liquor (44 mL). Lifestyle  Take daily care of your teeth and gums.  Stay active. Exercise for at least 30 minutes on 5 or more days each week.  Do not use any products that contain nicotine or tobacco, such as cigarettes, e-cigarettes, and chewing tobacco. If you need help quitting, ask your health care provider.  If you are sexually active, practice safe sex. Use a condom or other form of birth control (contraception) in order to prevent pregnancy and STIs (sexually transmitted infections).  If told by your health care provider, take low-dose aspirin daily starting at age 50. What's next?  Visit your health care provider once a year for a well check visit.  Ask your health care provider how often you should have your eyes and teeth checked.  Stay up to date on all vaccines. This information is not intended to replace advice given to you by your health care provider. Make sure you  discuss any questions you have with your health care provider. Document Released: 01/04/2016 Document Revised: 08/19/2018 Document Reviewed: 08/19/2018 Elsevier Patient Education  2020 [employer] Inc. Breast Self-Awareness Breast self-awareness is knowing how your breasts look and feel. Doing breast self-awareness is important. It allows you to catch a breast problem early while it is still small and can be treated. All women should do breast self-awareness, including women who have had breast implants. Tell your doctor if you notice a change in your breasts. What you need:  A mirror.  A well-lit room. How to do a breast self-exam A breast self-exam is one way to learn what is normal for your breasts and to check for changes. To do a breast self-exam: Look for changes  1. Take off all the clothes above your waist. 2. Stand in front of a mirror in a room with good lighting. 3. Put your hands on your hips. 4. Push your hands down. 5. Look at your breasts and nipples in the mirror to see if one breast or nipple looks   different from the other. Check to see if: ? The shape of one breast is different. ? The size of one breast is different. ? There are wrinkles, dips, and bumps in one breast and not the other. 6. Look at each breast for changes in the skin, such as: ? Redness. ? Scaly areas. 7. Look for changes in your nipples, such as: ? Liquid around the nipples. ? Bleeding. ? Dimpling. ? Redness. ? A change in where the nipples are. Feel for changes  1. Lie on your back on the floor. 2. Feel each breast. To do this, follow these steps: ? Pick a breast to feel. ? Put the arm closest to that breast above your head. ? Use your other arm to feel the nipple area of your breast. Feel the area with the pads of your three middle fingers by making small circles with your fingers. For the first circle, press lightly. For the second circle, press harder. For the third circle, press even harder.  ? Keep making circles with your fingers at the different pressures as you move down your breast. Stop when you feel your ribs. ? Move your fingers a little toward the center of your body. ? Start making circles with your fingers again, this time going up until you reach your collarbone. ? Keep making up-and-down circles until you reach your armpit. Remember to keep using the three pressures. ? Feel the other breast in the same way. 3. Sit or stand in the tub or shower. 4. With soapy water on your skin, feel each breast the same way you did in step 2 when you were lying on the floor. Write down what you find Writing down what you find can help you remember what to tell your doctor. Write down:  What is normal for each breast.  Any changes you find in each breast, including: ? The kind of changes you find. ? Whether you have pain. ? Size and location of any lumps.  When you last had your menstrual period. General tips  Check your breasts every month.  If you are breastfeeding, the best time to check your breasts is after you feed your baby or after you use a breast pump.  If you get menstrual periods, the best time to check your breasts is 5-7 days after your menstrual period is over.  With time, you will become comfortable with the self-exam, and you will begin to know if there are changes in your breasts. Contact a doctor if you:  See a change in the shape or size of your breasts or nipples.  See a change in the skin of your breast or nipples, such as red or scaly skin.  Have fluid coming from your nipples that is not normal.  Find a lump or thick area that was not there before.  Have pain in your breasts.  Have any concerns about your breast health. Summary  Breast self-awareness includes looking for changes in your breasts, as well as feeling for changes within your breasts.  Breast self-awareness should be done in front of a mirror in a well-lit room.  You should  check your breasts every month. If you get menstrual periods, the best time to check your breasts is 5-7 days after your menstrual period is over.  Let your doctor know of any changes you see in your breasts, including changes in size, changes on the skin, pain or tenderness, or fluid from your nipples that is not normal. This   normal. This information is not intended to replace advice given to you by your health care provider. Make sure you discuss any questions you have with your health care provider. Document Released: 05/26/2008 Document Revised: 07/27/2018 Document Reviewed: 07/27/2018 Elsevier Patient Education  2020 [employer].    Constipation, Adult Constipation is when a person has fewer bowel movements in a week than normal, has difficulty having a bowel movement, or has stools that are dry, hard, or larger than normal. Constipation may be caused by an underlying condition. It may become worse with age if a person takes certain medicines and does not take in enough fluids. Follow these instructions at home: Eating and drinking   Eat foods that have a lot of fiber, such as fresh fruits and vegetables, whole grains, and beans.  Limit foods that are high in fat, low in fiber, or overly processed, such as french fries, hamburgers, cookies, candies, and soda.  Drink enough fluid to keep your urine clear or pale yellow. General instructions  Exercise regularly or as told by your health care provider.  Go to the restroom when you have the urge to go. Do not hold it in.  Take over-the-counter and prescription medicines only as told by your health care provider. These include any fiber supplements.  Practice pelvic floor retraining exercises, such as deep breathing while relaxing the lower abdomen and pelvic floor relaxation during bowel movements.  Watch your condition for any changes.  Keep all follow-up visits as told by your health care provider. This is important. Contact a health  care provider if:  You have pain that gets worse.  You have a fever.  You do not have a bowel movement after 4 days.  You vomit.  You are not hungry.  You lose weight.  You are bleeding from the anus.  You have thin, pencil-like stools. Get help right away if:  You have a fever and your symptoms suddenly get worse.  You leak stool or have blood in your stool.  Your abdomen is bloated.  You have severe pain in your abdomen.  You feel dizzy or you faint. This information is not intended to replace advice given to you by your health care provider. Make sure you discuss any questions you have with your health care provider. Document Released: 09/05/2004 Document Revised: 11/20/2017 Document Reviewed: 05/28/2016 Elsevier Patient Education  [place].    Cardinal Health Content in Foods  See the following list for the dietary fiber content of some common foods. High-fiber foods High-fiber foods contain 4 grams or more (4g or more) of fiber per serving. They include:  Artichoke (fresh) - 1 medium has 10.3g of fiber.  Baked beans, plain or vegetarian (canned) -  cup has 5.2g of fiber.  Blackberries or raspberries (fresh) -  cup has 4g of fiber.  Bran cereal -  cup has 8.6g of fiber.  Bulgur (cooked) -  cup has 4g of fiber.  Kidney beans (canned) -  cup has 6.8g of fiber.  Lentils (cooked) -  cup has 7.8g of fiber.  Pear (fresh) - 1 medium has 5.1g of fiber.  Peas (frozen) -  cup has 4.4g of fiber.  Pinto beans (canned) -  cup has 5.5g of fiber.  Pinto beans (dried and cooked) -  cup has 7.7g of fiber.  Potato with skin (baked) - 1 medium has 4.4g of fiber.  Quinoa (cooked) -  cup has 5g of fiber.  Soybeans (canned, frozen, or fresh) -  cup has 5.1g of fiber. Moderate-fiber foods Moderate-fiber foods contain 1-4 grams (1-4g) of fiber per serving. They include:  Almonds - 1 oz. has 3.5g of fiber.  Apple with skin - 1 medium has 3.3g of fiber.   Applesauce, sweetened -  cup has 1.5g of fiber.  Bagel, plain - one 4-inch (10-cm) bagel has 2g of fiber.  Banana - 1 medium has 3.1g of fiber.  Broccoli (cooked) -  cup has 2.5g of fiber.  Carrots (cooked) -  cup has 2.3g of fiber.  Corn (canned or frozen) -  cup has 2.1g of fiber.  Corn tortilla - one 6-inch (15-cm) tortilla has 1.5g of fiber.  Green beans (canned) -  cup has 2g of fiber.  Instant oatmeal -  cup has about 2g of fiber.  Long-grain brown rice (cooked) - 1 cup has 3.5g of fiber.  Macaroni, enriched (cooked) - 1 cup has 2.5g of fiber.  Melon - 1 cup has 1.4g of fiber.  Multigrain cereal -  cup has about 2-4g of fiber.  Orange - 1 small has 3.1g of fiber.  Potatoes, mashed -  cup has 1.6g of fiber.  Raisins - 1/4 cup has 1.6g of fiber.  Squash -  cup has 2.9g of fiber.  Sunflower seeds -  cup has 1.1g of fiber.  Tomato - 1 medium has 1.5g of fiber.  Vegetable or soy patty - 1 has 3.4g of fiber.  Whole-wheat bread - 1 slice has 2g of fiber.  Whole-wheat spaghetti -  cup has 3.2g of fiber. Low-fiber foods Low-fiber foods contain less than 1 gram (less than 1g) of fiber per serving. They include:  Egg - 1 large.  Flour tortilla - one 6-inch (15-cm) tortilla.  Fruit juice -  cup.  Lettuce - 1 cup.  Meat, poultry, or fish - 1 oz.  Milk - 1 cup.  Spinach (raw) - 1 cup.  White bread - 1 slice.  White rice -  cup.  Yogurt -  cup. Actual amounts of fiber in foods may be different depending on processing. Talk with your dietitian about how much fiber you need in your diet. This information is not intended to replace advice given to you by your health care provider. Make sure you discuss any questions you have with your health care provider. Document Released: 04/26/2007 Document Revised: 07/31/2016 Document Reviewed: 01/31/2016 Elsevier Patient Education  2020 [employer].    Body Ringworm Body ringworm is an infection of  the skin that often causes a ring-shaped rash. Body ringworm is also called tinea corporis. Body ringworm can affect any part of your skin. This condition is easily spread from person to person (is very contagious). What are the causes? This condition is caused by fungi called dermatophytes. The condition develops when these fungi grow out of control on the skin. You can get this condition if you touch a person or animal that has it. You can also get it if you share any items with an infected person or pet. These include:  Clothing, bedding, and towels.  Brushes or combs.  Gym equipment.  Any other object that has the fungus on it. What increases the risk? You are more likely to develop this condition if you:  Play sports that involve close physical contact, such as wrestling.  Sweat a lot.  Live in areas that are hot and humid.  Use public showers.  Have a weakened immune system. What are the signs or symptoms? Symptoms of this  condition include:  Itchy, raised red spots and bumps.  Red scaly patches.  A ring-shaped rash. The rash may have: ? A clear center. ? Scales or red bumps at its center. ? Redness near its borders. ? Dry and scaly skin on or around it. How is this diagnosed? This condition can usually be diagnosed with a skin exam. A skin scraping may be taken from the affected area and examined under a microscope to see if the fungus is present. How is this treated? This condition may be treated with:  An antifungal cream or ointment.  An antifungal shampoo.  Antifungal medicines. These may be prescribed if your ringworm: ? Is severe. ? Keeps coming back. ? Lasts a long time. Follow these instructions at home:  Take over-the-counter and prescription medicines only as told by your health care provider.  If you were given an antifungal cream or ointment: ? Use it as told by your health care provider. ? Wash the infected area and dry it completely before  applying the cream or ointment.  If you were given an antifungal shampoo: ? Use it as told by your health care provider. ? Leave the shampoo on your body for 3-5 minutes before rinsing.  While you have a rash: ? Wear loose clothing to stop clothes from rubbing and irritating it. ? Wash or change your bed sheets every night. ? Disinfect or throw out items that may be infected. ? Wash clothes and bed sheets in hot water. ? Wash your hands often with soap and water. If soap and water are not available, use hand sanitizer.  If your pet has the same infection, take your pet to see a veterinarian for treatment. How is this prevented?  Take a bath or shower every day and after every time you work out or play sports.  Dry your skin completely after bathing.  Wear sandals or shoes in public places and showers.  Change your clothes every day.  Wash athletic clothes after each use.  Do not share personal items with others.  Avoid touching red patches of skin on other people.  Avoid touching pets that have bald spots.  If you touch an animal that has a bald spot, wash your hands. Contact a health care provider if:  Your rash continues to spread after 7 days of treatment.  Your rash is not gone in 4 weeks.  The area around your rash gets red, warm, tender, and swollen. Summary  Body ringworm is an infection of the skin that often causes a ring-shaped rash.  This condition is easily spread from person to person (is very contagious).  This condition may be treated with antifungal cream or ointment, antifungal shampoo, or antifungal medicines.  Take over-the-counter and prescription medicines only as told by your health care provider. This information is not intended to replace advice given to you by your health care provider. Make sure you discuss any questions you have with your health care provider. Document Released: 12/05/2000 Document Revised: 08/06/2018 Document Reviewed:  08/06/2018 Elsevier Patient Education  2020 [employer].

## 2019-10-14 ENCOUNTER — Ambulatory Visit
Admission: RE | Admit: 2019-10-14 | Discharge: 2019-10-14 | Disposition: A | Discharge status: Home / Self Care | Payer: Medicare HMO | Source: Ambulatory Visit | Attending: Obstetrics and Gynecology | Admitting: Obstetrics and Gynecology

## 2019-10-14 ENCOUNTER — Other Ambulatory Visit: Payer: Self-pay

## 2019-10-14 DIAGNOSIS — Z1239 Encounter for other screening for malignant neoplasm of breast: Secondary | ICD-10-CM

## 2019-10-14 DIAGNOSIS — Z1231 Encounter for screening mammogram for malignant neoplasm of breast: Secondary | ICD-10-CM | POA: Diagnosis present

## 2019-10-14 DIAGNOSIS — R928 Other abnormal and inconclusive findings on diagnostic imaging of breast: Secondary | ICD-10-CM | POA: Insufficient documentation

## 2019-10-17 ENCOUNTER — Other Ambulatory Visit: Payer: Self-pay | Admitting: Obstetrics and Gynecology

## 2019-10-17 DIAGNOSIS — R928 Other abnormal and inconclusive findings on diagnostic imaging of breast: Secondary | ICD-10-CM

## 2019-10-18 ENCOUNTER — Telehealth: Payer: Self-pay | Admitting: Obstetrics and Gynecology

## 2019-10-18 NOTE — Telephone Encounter (Signed)
Samantha from [facility] called and stated that she corrected additional imaging orders in epic and needs Dr. Marcelline Mates to sign off on them. Aldona Bar also stated that when a patient normally needs additional imaging the provider does not need to put in orders and also our office does not need to call the patient. Please advise.

## 2019-10-18 NOTE — Telephone Encounter (Signed)
There were no orders for her when they usually place them. We just call the patient to make sure that they are aware of any abnormal results.

## 2019-10-19 NOTE — Telephone Encounter (Signed)
[facility] called spoke to [place] concerning pt's u/s. Aldona Bar stated that all orders were in and she would contact pt today to schedule an appointment.

## 2019-10-19 NOTE — Telephone Encounter (Signed)
Spoke with pt and she is aware of test results and pt has spoke with [facility] several times. Waiting on orders to be placed.

## 2019-10-28 ENCOUNTER — Ambulatory Visit
Admission: RE | Admit: 2019-10-28 | Discharge: 2019-10-28 | Disposition: A | Discharge status: Home / Self Care | Payer: Medicare HMO | Source: Ambulatory Visit | Attending: Obstetrics and Gynecology | Admitting: Obstetrics and Gynecology

## 2019-10-28 DIAGNOSIS — R928 Other abnormal and inconclusive findings on diagnostic imaging of breast: Secondary | ICD-10-CM

## 2020-01-02 ENCOUNTER — Other Ambulatory Visit (HOSPITAL_COMMUNITY): Payer: Self-pay | Admitting: Internal Medicine

## 2020-01-02 ENCOUNTER — Other Ambulatory Visit: Payer: Self-pay

## 2020-01-02 ENCOUNTER — Ambulatory Visit (HOSPITAL_COMMUNITY)
Admission: RE | Admit: 2020-01-02 | Discharge: 2020-01-02 | Disposition: A | Discharge status: Home / Self Care | Payer: Medicare Other | Source: Ambulatory Visit | Attending: Internal Medicine | Admitting: Internal Medicine

## 2020-01-02 DIAGNOSIS — M7989 Other specified soft tissue disorders: Secondary | ICD-10-CM | POA: Diagnosis not present

## 2020-01-02 DIAGNOSIS — M79604 Pain in right leg: Secondary | ICD-10-CM

## 2020-01-02 DIAGNOSIS — R936 Abnormal findings on diagnostic imaging of limbs: Secondary | ICD-10-CM | POA: Insufficient documentation

## 2020-01-02 DIAGNOSIS — R7989 Other specified abnormal findings of blood chemistry: Secondary | ICD-10-CM | POA: Diagnosis not present

## 2020-01-02 NOTE — Progress Notes (Signed)
Right lower extremity venous duplex has been completed. Preliminary results can be found in CV Proc through chart review.  Results were given to [facility] at Dr. Olin Pia office.  01/02/20 1:13 PM Julie Lewis RVT

## 2020-01-05 ENCOUNTER — Other Ambulatory Visit: Payer: Self-pay | Admitting: Internal Medicine

## 2020-01-05 DIAGNOSIS — R1013 Epigastric pain: Secondary | ICD-10-CM

## 2020-01-12 ENCOUNTER — Other Ambulatory Visit: Payer: Self-pay

## 2020-01-12 ENCOUNTER — Ambulatory Visit
Admission: RE | Admit: 2020-01-12 | Discharge: 2020-01-12 | Disposition: A | Discharge status: Home / Self Care | Payer: Medicare Other | Source: Ambulatory Visit | Attending: Internal Medicine | Admitting: Internal Medicine

## 2020-01-12 DIAGNOSIS — R1013 Epigastric pain: Secondary | ICD-10-CM | POA: Diagnosis not present

## 2020-03-23 ENCOUNTER — Ambulatory Visit: Payer: Medicare Other | Attending: Internal Medicine

## 2020-03-23 DIAGNOSIS — Z23 Encounter for immunization: Secondary | ICD-10-CM

## 2020-03-23 NOTE — Progress Notes (Addendum)
   Covid-19 Vaccination Clinic  Name:  Julie Lewis    MRN: [id number] DOB: 1956/08/13  03/23/2020  Julie Lewis was observed post Covid-19 immunization for 30 minutes based on pre-vaccination screening without incident. She was provided with Vaccine Information Sheet and instruction to access the V-Safe system. Patient complained of feeling "flushed," EMS gave patient a bottle of water and turned on fan. Patient stated she felt better and did not experience any other symptoms.  Julie Lewis was instructed to call 911 with any severe reactions post vaccine: Marland Kitchen Difficulty breathing  . Swelling of face and throat  . A fast heartbeat  . A bad rash all over body  . Dizziness and weakness   Immunizations Administered    Name Date Dose VIS Date Route   Pfizer COVID-19 Vaccine 03/23/2020 12:43 PM 0.3 mL 12/02/2019 Intramuscular   Manufacturer: [place]   Lot: [id number]   [place]: [id number]

## 2020-04-16 ENCOUNTER — Ambulatory Visit: Payer: Medicare Other | Attending: Internal Medicine

## 2020-04-16 DIAGNOSIS — Z23 Encounter for immunization: Secondary | ICD-10-CM

## 2020-04-16 NOTE — Progress Notes (Signed)
   Covid-19 Vaccination Clinic  Name:  Julie Lewis    MRN: [id number] DOB: 03-12-1956  04/16/2020  Julie Lewis was observed post Covid-19 immunization for 15 minutes without incident. She was provided with Vaccine Information Sheet and instruction to access the V-Safe system.   Julie Lewis was instructed to call 911 with any severe reactions post vaccine: Marland Kitchen Difficulty breathing  . Swelling of face and throat  . A fast heartbeat  . A bad rash all over body  . Dizziness and weakness   Immunizations Administered    Name Date Dose VIS Date Route   Pfizer COVID-19 Vaccine 04/16/2020  1:20 PM 0.3 mL 02/15/2019 Intramuscular   Manufacturer: [place]   Lot: [id number]   [place]: [id number]

## 2020-08-14 ENCOUNTER — Telehealth: Payer: Self-pay

## 2020-08-14 NOTE — Telephone Encounter (Signed)
Spoke to pt concerning receiving a notification from the nurse advise line. Pt called the nurse advise line statign that she was having pain and burning with urination and itching in the vaginal area. Pt stated that she was treated an urgent care and was given medication for yeast infection and UTI. Pt was advised that if she was still having the symptoms after a week of finishing the medication to please contact the office and schedule an appointment to be seen. Pt voiced that she understood.

## 2020-09-04 ENCOUNTER — Other Ambulatory Visit: Payer: Self-pay

## 2020-09-04 ENCOUNTER — Encounter: Payer: Self-pay | Admitting: Obstetrics and Gynecology

## 2020-09-04 ENCOUNTER — Ambulatory Visit (INDEPENDENT_AMBULATORY_CARE_PROVIDER_SITE_OTHER): Payer: Medicare HMO | Admitting: Obstetrics and Gynecology

## 2020-09-04 VITALS — BP 87/54 | HR 65 | Ht 65.0 in | Wt 162.1 lb

## 2020-09-04 DIAGNOSIS — N952 Postmenopausal atrophic vaginitis: Secondary | ICD-10-CM

## 2020-09-04 DIAGNOSIS — Z1211 Encounter for screening for malignant neoplasm of colon: Secondary | ICD-10-CM

## 2020-09-04 DIAGNOSIS — N950 Postmenopausal bleeding: Secondary | ICD-10-CM

## 2020-09-04 DIAGNOSIS — E663 Overweight: Secondary | ICD-10-CM

## 2020-09-04 DIAGNOSIS — Z1239 Encounter for other screening for malignant neoplasm of breast: Secondary | ICD-10-CM

## 2020-09-04 DIAGNOSIS — Z01419 Encounter for gynecological examination (general) (routine) without abnormal findings: Secondary | ICD-10-CM

## 2020-09-04 DIAGNOSIS — I952 Hypotension due to drugs: Secondary | ICD-10-CM

## 2020-09-04 NOTE — Progress Notes (Signed)
Pt present for annual exam. Pt stated she noticed spotting while on vacation and out of town about a month again on 08/03/2020. Pt stated she was tried at a clinic while out of town for a yeast infection and uti.

## 2020-09-04 NOTE — Patient Instructions (Signed)
Preventive Care 40-64 Years Old, Female Preventive care refers to visits with your health care provider and lifestyle choices that can promote health and wellness. This includes:  A yearly physical exam. This may also be called an annual well check.  Regular dental visits and eye exams.  Immunizations.  Screening for certain conditions.  Healthy lifestyle choices, such as eating a healthy diet, getting regular exercise, not using drugs or products that contain nicotine and tobacco, and limiting alcohol use. What can I expect for my preventive care visit? Physical exam Your health care provider will check your:  Height and weight. This may be used to calculate body mass index (BMI), which tells if you are at a healthy weight.  Heart rate and blood pressure.  Skin for abnormal spots. Counseling Your health care provider may ask you questions about your:  Alcohol, tobacco, and drug use.  Emotional well-being.  Home and relationship well-being.  Sexual activity.  Eating habits.  Work and work environment.  Method of birth control.  Menstrual cycle.  Pregnancy history. What immunizations do I need?  Influenza (flu) vaccine  This is recommended every year. Tetanus, diphtheria, and pertussis (Tdap) vaccine  You may need a Td booster every 10 years. Varicella (chickenpox) vaccine  You may need this if you have not been vaccinated. Zoster (shingles) vaccine  You may need this after age 60. Measles, mumps, and rubella (MMR) vaccine  You may need at least one dose of MMR if you were born in 1957 or later. You may also need a second dose. Pneumococcal conjugate (PCV13) vaccine  You may need this if you have certain conditions and were not previously vaccinated. Pneumococcal polysaccharide (PPSV23) vaccine  You may need one or two doses if you smoke cigarettes or if you have certain conditions. Meningococcal conjugate (MenACWY) vaccine  You may need this if you  have certain conditions. Hepatitis A vaccine  You may need this if you have certain conditions or if you travel or work in places where you may be exposed to hepatitis A. Hepatitis B vaccine  You may need this if you have certain conditions or if you travel or work in places where you may be exposed to hepatitis B. Haemophilus influenzae type b (Hib) vaccine  You may need this if you have certain conditions. Human papillomavirus (HPV) vaccine  If recommended by your health care provider, you may need three doses over 6 months. You may receive vaccines as individual doses or as more than one vaccine together in one shot (combination vaccines). Talk with your health care provider about the risks and benefits of combination vaccines. What tests do I need? Blood tests  Lipid and cholesterol levels. These may be checked every 5 years, or more frequently if you are over 50 years old.  Hepatitis C test.  Hepatitis B test. Screening  Lung cancer screening. You may have this screening every year starting at age 55 if you have a 30-pack-year history of smoking and currently smoke or have quit within the past 15 years.  Colorectal cancer screening. All adults should have this screening starting at age 50 and continuing until age 75. Your health care provider may recommend screening at age 45 if you are at increased risk. You will have tests every 1-10 years, depending on your results and the type of screening test.  Diabetes screening. This is done by checking your blood sugar (glucose) after you have not eaten for a while (fasting). You may have this   done every 1-3 years.  Mammogram. This may be done every 1-2 years. Talk with your health care provider about when you should start having regular mammograms. This may depend on whether you have a family history of breast cancer.  BRCA-related cancer screening. This may be done if you have a family history of breast, ovarian, tubal, or peritoneal  cancers.  Pelvic exam and Pap test. This may be done every 3 years starting at age 60. Starting at age 7, this may be done every 5 years if you have a Pap test in combination with an HPV test. Other tests  Sexually transmitted disease (STD) testing.  Bone density scan. This is done to screen for osteoporosis. You may have this scan if you are at high risk for osteoporosis. Follow these instructions at home: Eating and drinking  Eat a diet that includes fresh fruits and vegetables, whole grains, lean protein, and low-fat dairy.  Take vitamin and mineral supplements as recommended by your health care provider.  Do not drink alcohol if: ? Your health care provider tells you not to drink. ? You are pregnant, may be pregnant, or are planning to become pregnant.  If you drink alcohol: ? Limit how much you have to 0-1 drink a day. ? Be aware of how much alcohol is in your drink. In the [place] one drink equals one 12 oz bottle of beer (355 mL), one 5 oz glass of wine (148 mL), or one 1 oz glass of hard liquor (44 mL). Lifestyle  Take daily care of your teeth and gums.  Stay active. Exercise for at least 30 minutes on 5 or more days each week.  Do not use any products that contain nicotine or tobacco, such as cigarettes, e-cigarettes, and chewing tobacco. If you need help quitting, ask your health care provider.  If you are sexually active, practice safe sex. Use a condom or other form of birth control (contraception) in order to prevent pregnancy and STIs (sexually transmitted infections).  If told by your health care provider, take low-dose aspirin daily starting at age 48. What's next?  Visit your health care provider once a year for a well check visit.  Ask your health care provider how often you should have your eyes and teeth checked.  Stay up to date on all vaccines. This information is not intended to replace advice given to you by your health care provider. Make sure you  discuss any questions you have with your health care provider. Document Revised: 08/19/2018 Document Reviewed: 08/19/2018 Elsevier Patient Education  2020 [place] Breast self-awareness is knowing how your breasts look and feel. Doing breast self-awareness is important. It allows you to catch a breast problem early while it is still small and can be treated. All women should do breast self-awareness, including women who have had breast implants. Tell your doctor if you notice a change in your breasts. What you need:  A mirror.  A well-lit room. How to do a breast self-exam A breast self-exam is one way to learn what is normal for your breasts and to check for changes. To do a breast self-exam: Look for changes  1. Take off all the clothes above your waist. 2. Stand in front of a mirror in a room with good lighting. 3. Put your hands on your hips. 4. Push your hands down. 5. Look at your breasts and nipples in the mirror to see if one breast or nipple looks different from the  other. Check to see if: ? The shape of one breast is different. ? The size of one breast is different. ? There are wrinkles, dips, and bumps in one breast and not the other. 6. Look at each breast for changes in the skin, such as: ? Redness. ? Scaly areas. 7. Look for changes in your nipples, such as: ? Liquid around the nipples. ? Bleeding. ? Dimpling. ? Redness. ? A change in where the nipples are. Feel for changes  1. Lie on your back on the floor. 2. Feel each breast. To do this, follow these steps: ? Pick a breast to feel. ? Put the arm closest to that breast above your head. ? Use your other arm to feel the nipple area of your breast. Feel the area with the pads of your three middle fingers by making small circles with your fingers. For the first circle, press lightly. For the second circle, press harder. For the third circle, press even harder. ? Keep making circles with  your fingers at the different pressures as you move down your breast. Stop when you feel your ribs. ? Move your fingers a little toward the center of your body. ? Start making circles with your fingers again, this time going up until you reach your collarbone. ? Keep making up-and-down circles until you reach your armpit. Remember to keep using the three pressures. ? Feel the other breast in the same way. 3. Sit or stand in the tub or shower. 4. With soapy water on your skin, feel each breast the same way you did in step 2 when you were lying on the floor. Write down what you find Writing down what you find can help you remember what to tell your doctor. Write down:  What is normal for each breast.  Any changes you find in each breast, including: ? The kind of changes you find. ? Whether you have pain. ? Size and location of any lumps.  When you last had your menstrual period. General tips  Check your breasts every month.  If you are breastfeeding, the best time to check your breasts is after you feed your baby or after you use a breast pump.  If you get menstrual periods, the best time to check your breasts is 5-7 days after your menstrual period is over.  With time, you will become comfortable with the self-exam, and you will begin to know if there are changes in your breasts. Contact a doctor if you:  See a change in the shape or size of your breasts or nipples.  See a change in the skin of your breast or nipples, such as red or scaly skin.  Have fluid coming from your nipples that is not normal.  Find a lump or thick area that was not there before.  Have pain in your breasts.  Have any concerns about your breast health. Summary  Breast self-awareness includes looking for changes in your breasts, as well as feeling for changes within your breasts.  Breast self-awareness should be done in front of a mirror in a well-lit room.  You should check your breasts every month.  If you get menstrual periods, the best time to check your breasts is 5-7 days after your menstrual period is over.  Let your doctor know of any changes you see in your breasts, including changes in size, changes on the skin, pain or tenderness, or fluid from your nipples that is not normal. This information is not  intended to replace advice given to you by your health care provider. Make sure you discuss any questions you have with your health care provider. Document Revised: 07/27/2018 Document Reviewed: 07/27/2018 Elsevier Patient Education  [place].

## 2020-09-04 NOTE — Progress Notes (Signed)
ANNUAL PREVENTATIVE CARE GYNECOLOGY  ENCOUNTER NOTE  Subjective:       Julie Lewis is a 64 y.o. G0P0000 postmenopausal female here for a routine annual gynecologic exam. The patient is not sexually active. The patient is not taking hormone replacement therapy. Patient reports post-menopausal vaginal bleeding x 1 episode (see below). The patient wears seatbelts: yes. The patient participates in regular exercise: no. Has the patient ever been transfused or tattooed?: no. The patient reports that there is not domestic violence in her life.  Current complaints: 1.  Patient reports that she was away on vacation in August (08/03/2020) and had an episode of vaginal spotting.  She reports that she went to the urgent care and was treated for a UTI and also given yeast infection prophylactically.  She notes that she has not had any further episodes since then, however just wanted to inform provider. 2.  Patient also complains of sometimes feeling dizzy or woozy especially when changing positions.  She has noticed that her blood pressures have been lower than normal lately.  Thinks it may be due to one of her medications. 3.  Lastly, patient desires to reported an episode of diarrhea that lasted for approximately 10 days.  This occurred several weeks ago.  Denied any nausea or vomiting.  Notes she is no longer having this problem.   Gynecologic History Patient's last menstrual period was 08/03/2020. Contraception: post menopausal status Last Pap: 08/17/2018. H/o abnormal pap x 2 in the past, requiring laser ablation.  Last mammogram: 10/14/2019.  Results are normal.  Last Colonoscopy: Unsure of date but thinks she is due in another 1-2 years.  Last Dexa Scan: Never had one PCP - Dr. Denita Lung [facility] [place])  Obstetric History OB History  Gravida Para Term Preterm AB Living  0 0 0 0 0 0  SAB TAB Ectopic Multiple Live Births  0 0 0 0 0    Past Medical History:    Diagnosis Date  . Cancer ([place])    melanoma  . Depression   . History of herpes genitalis 02/11/2018    Family History  Problem Relation Age of Onset  . Heart failure Mother   . Diabetes Mother   . Hypertension Father   . Hypertension Brother   . Breast cancer Cousin     Past Surgical History:  Procedure Laterality Date  . finger surgica;    . GALLBLADDER SURGERY    . hemmorriod sugery    . LEG SURGERY    . OVARIAN CYST REMOVAL      Social History   Socioeconomic History  . Marital status: Married    Spouse name: Not on file  . Number of children: Not on file  . Years of education: Not on file  . Highest education level: Not on file  Occupational History  . Not on file  Tobacco Use  . Smoking status: Never Smoker  . Smokeless tobacco: Never Used  Vaping Use  . Vaping Use: Never used  Substance and Sexual Activity  . Alcohol use: Yes    Comment: occas  . Drug use: No  . Sexual activity: Never    Birth control/protection: Post-menopausal  Other Topics Concern  . Not on file  Social History Narrative  . Not on file   Social Determinants of Health   Financial Resource Strain:   . Difficulty of Paying Living Expenses: Not on file  Food Insecurity:   . Worried About [employer] of  Food in the Last Year: Not on file  . Ran Out of Food in the Last Year: Not on file  Transportation Needs:   . Lack of Transportation (Medical): Not on file  . Lack of Transportation (Non-Medical): Not on file  Physical Activity:   . Days of Exercise per Week: Not on file  . Minutes of Exercise per Session: Not on file  Stress:   . Feeling of Stress : Not on file  Social Connections:   . Frequency of Communication with Friends and Family: Not on file  . Frequency of Social Gatherings with Friends and Family: Not on file  . Attends Religious Services: Not on file  . Active Member of Clubs or Organizations: Not on file  . Attends Archivist Meetings: Not on file  .  Marital Status: Not on file  Intimate Partner Violence:   . Fear of Current or Ex-Partner: Not on file  . Emotionally Abused: Not on file  . Physically Abused: Not on file  . Sexually Abused: Not on file    Current Outpatient Medications on File Prior to Visit  Medication Sig Dispense Refill  . atorvastatin (LIPITOR) 40 MG tablet Take 40 mg by mouth daily.    . citalopram (CELEXA) 10 MG tablet Take 10 mg by mouth daily.    . Flaxseed, Linseed, (FLAX SEED OIL) 1000 MG CAPS Take by mouth.    . gabapentin (NEURONTIN) 300 MG capsule Take 300 mg by mouth 3 (three) times daily.    . MELOXICAM PO Take by mouth.    . primidone (MYSOLINE) 250 MG tablet Take 250 mg by mouth daily.    . propranolol ER (INDERAL LA) 120 MG 24 hr capsule Take 120 mg by mouth daily.    . TRAMADOL HCL PO Take by mouth.    Marland Kitchen UNABLE TO FIND Vit B 17    . valACYclovir (VALTREX) 500 MG tablet Take 500 mg by mouth 2 (two) times daily.     No current facility-administered medications on file prior to visit.    Allergies  Allergen Reactions  . Erythromycin     Severe stomach issues   . Ivp Dye [Iodinated Diagnostic Agents]   . Wellbutrin [Bupropion] Other (See Comments)  . Penicillins Rash      Review of Systems ROS Review of Systems - General ROS: negative for - chills, fatigue, fever, hot flashes, night sweats, weight gain or weight loss Psychological ROS: negative for - anxiety, decreased libido, depression, mood swings, physical abuse or sexual abuse Ophthalmic ROS: negative for - blurry vision, eye pain or loss of vision ENT ROS: negative for - headaches, hearing change, visual changes or vocal changes Allergy and Immunology ROS: negative for - hives, itchy/watery eyes or seasonal allergies Hematological and Lymphatic ROS: negative for - bleeding problems, bruising, swollen lymph nodes or weight loss Endocrine ROS: negative for - galactorrhea, hair pattern changes, hot flashes, malaise/lethargy, mood  swings, palpitations, polydipsia/polyuria, skin changes, temperature intolerance or unexpected weight changes Breast ROS: negative for - new or changing breast lumps or nipple discharge Respiratory ROS: negative for - cough or shortness of breath Cardiovascular ROS: negative for - chest pain, irregular heartbeat, palpitations or shortness of breath Gastrointestinal ROS: no abdominal pain, change in bowel habits, or black or bloody stools Genito-Urinary ROS: no dysuria, trouble voiding, or hematuria Musculoskeletal ROS: negative for - joint pain or joint stiffness Neurological ROS: negative for - bowel and bladder control changes. Positive for intermittent dizziness (see HPI)  Dermatological ROS: negative for rash and skin changes.    Objective:   BP (!) 87/54   Pulse 65   Ht 5\' 5"  (1.651 m)   Wt 162 lb 1.6 oz (73.5 kg)   LMP 08/03/2020   BMI 26.97 kg/m  CONSTITUTIONAL: Well-developed, well-nourished female in no acute distress. Overweight PSYCHIATRIC: Normal mood and affect. Normal behavior. Normal judgment and thought content. [place]: Alert and oriented to person, place, and time. Normal muscle tone coordination. No cranial nerve deficit noted. HENT:  Normocephalic, atraumatic, External right and left ear normal. Oropharynx is clear and moist EYES: Conjunctivae and EOM are normal. Pupils are equal, round, and reactive to light. No scleral icterus.  NECK: Normal range of motion, supple, no masses.  Normal thyroid.  SKIN: Skin is warm and dry. No rash noted. Not diaphoretic. No erythema. No pallor.  CARDIOVASCULAR: Normal heart rate noted, regular rhythm, no murmur. RESPIRATORY: Clear to auscultation bilaterally. Effort and breath sounds normal, no problems with respiration noted. BREASTS: Symmetric in size. No masses, skin changes, nipple drainage, or lymphadenopathy. ABDOMEN: Soft, normal bowel sounds, no distention noted.  No tenderness, rebound or guarding.  BLADDER:  Normal PELVIC:  Bladder no bladder distension noted  Urethra: normal appearing urethra with no masses, tenderness or lesions  Vulva: normal appearing vulva with no masses, tenderness or lesions  Vagina: normal appearing vagina with mild vaginal atrophy, no discharge, no lesions  Cervix: normal appearing cervix without discharge or lesions  Uterus: uterus is normal size, shape, consistency and nontender  Adnexa: normal adnexa in size, nontender and no masses  RV: External Exam NormaI, No Rectal Masses and Normal Sphincter tone  MUSCULOSKELETAL: Normal range of motion. No tenderness.  No cyanosis, clubbing, or edema.  2+ distal pulses. LYMPHATIC: No Axillary, Supraclavicular, or Inguinal Adenopathy.   Labs: No new labs  Assessment:   Annual gynecologic examination 64 y.o. Contraception: post menopausal status Overweight  Mild vaginal atrophy Hypotension Postmenopausal bleeding  Plan:  - Pap: Not done. Patient is up to date. Next pap smear in 2022. - Mammogram: Ordered - Stool Guaiac Testing:  Not Ordered.  Review of Care Everywhere notes last colonoscopy was performed in 2007.  Patient is actually overdue.  Will order referral to GI.  - Labs: None ordered. Patient routinely has labs done by PCP  - Dexa Scan: to be performed at age 69.  Will order next year. - Routine preventative health maintenance measures emphasized: Exercise/Diet/Weight control, Alcohol/Substance use risks and Stress Management  -  Patient currently with hypotension, thinks it may be due to one of her medications (propranolol, use for tremors) as it was just released increased not long ago.  Advised the patient to discuss with her neurologist on changing dose or changing medication.  Patient notes she has an appointment within the next week.  Patient did also have an episode of diarrhea several weeks ago which could lead to some possible dehydration which could also affect her blood pressures however she reports  that the diarrhea was more of an increase in stool episodes, was not completely watery. - Mild vaginal atrophy, discontinued Premarin cream last year, no issues.  -Patient with one episode of vaginal spotting today, however based on records from her August visit to urgent care URI.  Limited no further work-up indicated at this time.  However, if patient continues to have episodes of postmenopausal bleeding you will need to return for further evaluation. 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 - Patient notes that  she will hold on getting flu vaccine.  - Return to clinic in 1 year for annual exam.     Rubie Maid, MD [facility]

## 2020-09-25 ENCOUNTER — Telehealth: Payer: Self-pay

## 2020-09-25 NOTE — Telephone Encounter (Signed)
Returned patient's call to informed her the visit will be a telephone call for CC screening. Pt verbalized understanding.

## 2020-09-26 ENCOUNTER — Telehealth: Payer: Medicare Other

## 2020-09-26 ENCOUNTER — Telehealth: Payer: Self-pay

## 2020-09-26 NOTE — Telephone Encounter (Signed)
Patient was scheduled for a telephone nurse visit at 2pm today.  I attempted to contact her at 2:04pm.  LVM for her to call the office to reschedule her appt since she missed my call.  Thanks,  [place], [place]

## 2020-10-04 ENCOUNTER — Other Ambulatory Visit: Payer: Self-pay

## 2020-10-04 ENCOUNTER — Telehealth (INDEPENDENT_AMBULATORY_CARE_PROVIDER_SITE_OTHER): Payer: Medicare Other | Admitting: Gastroenterology

## 2020-10-04 DIAGNOSIS — C439 Malignant melanoma of skin, unspecified: Secondary | ICD-10-CM | POA: Insufficient documentation

## 2020-10-04 DIAGNOSIS — D239 Other benign neoplasm of skin, unspecified: Secondary | ICD-10-CM | POA: Insufficient documentation

## 2020-10-04 DIAGNOSIS — D693 Immune thrombocytopenic purpura: Secondary | ICD-10-CM | POA: Insufficient documentation

## 2020-10-04 DIAGNOSIS — Z1211 Encounter for screening for malignant neoplasm of colon: Secondary | ICD-10-CM

## 2020-10-04 MED ORDER — NA SULFATE-K SULFATE-MG SULF 17.5-3.13-1.6 GM/177ML PO SOLN: 1.0000 | Freq: Once | ORAL | 0 refills | Status: AC

## 2020-10-04 NOTE — Progress Notes (Signed)
Gastroenterology Pre-Procedure Review  Request Date: Thursday 10/18/20 Requesting Physician: Dr. Bonna Gains  PATIENT REVIEW QUESTIONS: The patient responded to the following health history questions as indicated:    1. Are you having any GI issues? Constipation 2. Do you have a personal history of Polyps? no 3. Do you have a family history of Colon Cancer or Polyps? Aunt may have had colon cancer 4. Diabetes Mellitus? no 5. Joint replacements in the past 12 months?no 6. Major health problems in the past 3 months?no 7. Any artificial heart valves, MVP, or defibrillator?no    MEDICATIONS & ALLERGIES:    Patient reports the following regarding taking any anticoagulation/antiplatelet therapy:   Plavix, Coumadin, Eliquis, Xarelto, Lovenox, Pradaxa, Brilinta, or Effient? no Aspirin? no  Patient confirms/reports the following medications:  Current Outpatient Medications  Medication Sig Dispense Refill  . atorvastatin (LIPITOR) 40 MG tablet Take 40 mg by mouth daily.    . citalopram (CELEXA) 10 MG tablet Take 10 mg by mouth daily.    . Flaxseed Oil (LINSEED OIL) OIL Use 1,000 mg every morning    . gabapentin (NEURONTIN) 300 MG capsule Take 300 mg by mouth 3 (three) times daily.    . MELOXICAM PO Take by mouth.    . primidone (MYSOLINE) 250 MG tablet Take 250 mg by mouth daily.    . propranolol ER (INDERAL LA) 120 MG 24 hr capsule Take 120 mg by mouth daily.    Marland Kitchen Resveratrol 100 MG CAPS Take 1 tablet by mouth every morning.    Marland Kitchen TRAMADOL HCL PO Take by mouth.    . valACYclovir (VALTREX) 500 MG tablet Take 500 mg by mouth 2 (two) times daily.    . Na Sulfate-K Sulfate-Mg Sulf 17.5-3.13-1.6 GM/177ML SOLN Take 1 kit by mouth once for 1 dose. 354 mL 0   No current facility-administered medications for this visit.    Patient confirms/reports the following allergies:  Allergies  Allergen Reactions  . Bupropion Other (See Comments)    myoclonus  . Erythromycin     Severe stomach issues    . Iodinated Diagnostic Agents Rash    Contrast dye  . Penicillins Rash    No orders of the defined types were placed in this encounter.   AUTHORIZATION INFORMATION Primary Insurance: 1D#: Group #:  Secondary Insurance: 1D#: Group #:  SCHEDULE INFORMATION: Date: Thursday 10/18/20 Time: Location:[facility]

## 2020-10-05 ENCOUNTER — Telehealth: Payer: Self-pay

## 2020-10-05 NOTE — Telephone Encounter (Signed)
Returned patients call. Left message for patient to call the office back to reschedule procedure.

## 2020-10-16 ENCOUNTER — Other Ambulatory Visit: Payer: Medicare Other | Attending: Gastroenterology

## 2020-10-18 ENCOUNTER — Ambulatory Visit: Admission: RE | Admit: 2020-10-18 | Payer: Medicare Other | Source: Home / Self Care | Admitting: Gastroenterology

## 2020-10-18 ENCOUNTER — Encounter: Admission: RE | Payer: Self-pay | Source: Home / Self Care

## 2020-10-18 SURGERY — COLONOSCOPY WITH PROPOFOL
Anesthesia: General

## 2020-11-14 NOTE — Telephone Encounter (Signed)
Patient ready to reschedule canceled colonoscopy procedure. Please call back to resch. Pt had screening call on 10.14.21.

## 2020-11-19 ENCOUNTER — Ambulatory Visit
Admission: RE | Admit: 2020-11-19 | Discharge: 2020-11-19 | Disposition: A | Discharge status: Home / Self Care | Payer: Medicare Other | Source: Ambulatory Visit | Attending: Obstetrics and Gynecology | Admitting: Obstetrics and Gynecology

## 2020-11-19 ENCOUNTER — Other Ambulatory Visit: Payer: Self-pay

## 2020-11-19 DIAGNOSIS — Z1239 Encounter for other screening for malignant neoplasm of breast: Secondary | ICD-10-CM

## 2020-11-19 DIAGNOSIS — Z1231 Encounter for screening mammogram for malignant neoplasm of breast: Secondary | ICD-10-CM | POA: Diagnosis not present

## 2020-11-19 DIAGNOSIS — Z1211 Encounter for screening for malignant neoplasm of colon: Secondary | ICD-10-CM

## 2020-12-07 ENCOUNTER — Other Ambulatory Visit
Admission: RE | Admit: 2020-12-07 | Discharge: 2020-12-07 | Disposition: A | Discharge status: Home / Self Care | Payer: Medicare Other | Source: Ambulatory Visit | Attending: Gastroenterology | Admitting: Gastroenterology

## 2020-12-07 ENCOUNTER — Other Ambulatory Visit: Payer: Self-pay

## 2020-12-07 DIAGNOSIS — Z20822 Contact with and (suspected) exposure to covid-19: Secondary | ICD-10-CM | POA: Insufficient documentation

## 2020-12-07 DIAGNOSIS — Z01812 Encounter for preprocedural laboratory examination: Secondary | ICD-10-CM | POA: Insufficient documentation

## 2020-12-07 LAB — SARS CORONAVIRUS 2 (TAT 6-24 HRS): SARS Coronavirus 2: NEGATIVE

## 2020-12-11 ENCOUNTER — Other Ambulatory Visit: Payer: Self-pay

## 2020-12-11 ENCOUNTER — Ambulatory Visit: Payer: Medicare Other | Admitting: Anesthesiology

## 2020-12-11 ENCOUNTER — Encounter: Payer: Self-pay | Admitting: Gastroenterology

## 2020-12-11 ENCOUNTER — Encounter
Admission: RE | Disposition: A | Discharge status: Home / Self Care | Payer: Self-pay | Source: Home / Self Care | Attending: Gastroenterology

## 2020-12-11 ENCOUNTER — Ambulatory Visit
Admission: RE | Admit: 2020-12-11 | Discharge: 2020-12-11 | Disposition: A | Discharge status: Home / Self Care | Payer: Medicare Other | Attending: Gastroenterology | Admitting: Gastroenterology

## 2020-12-11 DIAGNOSIS — Z79899 Other long term (current) drug therapy: Secondary | ICD-10-CM | POA: Insufficient documentation

## 2020-12-11 DIAGNOSIS — K648 Other hemorrhoids: Secondary | ICD-10-CM | POA: Insufficient documentation

## 2020-12-11 DIAGNOSIS — Z888 Allergy status to other drugs, medicaments and biological substances status: Secondary | ICD-10-CM | POA: Insufficient documentation

## 2020-12-11 DIAGNOSIS — Z85820 Personal history of malignant melanoma of skin: Secondary | ICD-10-CM | POA: Insufficient documentation

## 2020-12-11 DIAGNOSIS — Z1211 Encounter for screening for malignant neoplasm of colon: Secondary | ICD-10-CM | POA: Diagnosis present

## 2020-12-11 DIAGNOSIS — Z880 Allergy status to penicillin: Secondary | ICD-10-CM | POA: Diagnosis not present

## 2020-12-11 DIAGNOSIS — Z881 Allergy status to other antibiotic agents status: Secondary | ICD-10-CM | POA: Diagnosis not present

## 2020-12-11 DIAGNOSIS — Z91041 Radiographic dye allergy status: Secondary | ICD-10-CM | POA: Diagnosis not present

## 2020-12-11 HISTORY — PX: COLONOSCOPY WITH PROPOFOL: SHX5780

## 2020-12-11 SURGERY — COLONOSCOPY WITH PROPOFOL
Anesthesia: General

## 2020-12-11 MED ORDER — PROPOFOL 10 MG/ML IV BOLUS
INTRAVENOUS | Status: DC | PRN
  Administered 2020-12-11: 40 mg via INTRAVENOUS

## 2020-12-11 MED ORDER — LIDOCAINE HCL (PF) 2 % IJ SOLN
INTRAMUSCULAR | Status: AC
  Filled 2020-12-11: qty 15

## 2020-12-11 MED ORDER — PROPOFOL 500 MG/50ML IV EMUL
INTRAVENOUS | Status: DC | PRN
  Administered 2020-12-11: 140 ug/kg/min via INTRAVENOUS

## 2020-12-11 MED ORDER — SODIUM CHLORIDE 0.9 % IV SOLN: INTRAVENOUS | Status: DC

## 2020-12-11 MED ORDER — LIDOCAINE HCL (CARDIAC) PF 100 MG/5ML IV SOSY
PREFILLED_SYRINGE | INTRAVENOUS | Status: DC | PRN
  Administered 2020-12-11: 80 mg via INTRAVENOUS

## 2020-12-11 MED ORDER — PROPOFOL 500 MG/50ML IV EMUL
INTRAVENOUS | Status: AC
  Filled 2020-12-11: qty 50

## 2020-12-11 MED ORDER — GLYCOPYRROLATE 0.2 MG/ML IJ SOLN
INTRAMUSCULAR | Status: DC | PRN
  Administered 2020-12-11: .2 mg via INTRAVENOUS

## 2020-12-11 MED ORDER — PROPOFOL 10 MG/ML IV BOLUS
INTRAVENOUS | Status: AC
  Filled 2020-12-11: qty 20

## 2020-12-11 NOTE — Op Note (Signed)
[facility] Gastroenterology Patient Name: Julie Lewis Procedure Date: 12/11/2020 7:11 AM MRN: [id number] Account #: [id number] Date of Birth: 1956-01-26 Admit Type: Outpatient Age: 64 Room: [facility] ENDO ROOM 3 Gender: Female Note Status: Finalized Procedure:             Colonoscopy Indications:           Screening for colorectal malignant neoplasm Providers:             Pranika Finks B. Bonna Gains MD, MD Referring MD:          Denita Lung (Referring MD) Medicines:             Monitored Anesthesia Care Complications:         No immediate complications. Procedure:             Pre-Anesthesia Assessment:                        - Prior to the procedure, a History and Physical was                         performed, and patient medications, allergies and                         sensitivities were reviewed. The patient's tolerance                         of previous anesthesia was reviewed.                        - The risks and benefits of the procedure and the                         sedation options and risks were discussed with the                         patient. All questions were answered and informed                         consent was obtained.                        - Patient identification and proposed procedure were                         verified prior to the procedure by the physician, the                         nurse, the anesthetist and the technician. The                         procedure was verified in the pre-procedure area in                         the procedure room in the endoscopy suite.                        - ASA Grade Assessment: II - A patient with mild  systemic disease.                        - After reviewing the risks and benefits, the patient                         was deemed in satisfactory condition to undergo the                         procedure.                        After obtaining informed consent, the  colonoscope was                         passed under direct vision. Throughout the procedure,                         the patient's blood pressure, pulse, and oxygen                         saturations were monitored continuously. The                         Colonoscope was introduced through the anus and                         advanced to the the cecum, identified by appendiceal                         orifice and ileocecal valve. The colonoscopy was                         performed with ease. The patient tolerated the                         procedure well. The quality of the bowel preparation                         was good. Findings:      The perianal and digital rectal examinations were normal.      The rectum, sigmoid colon, descending colon, transverse colon, ascending       colon and cecum appeared normal.      Non-bleeding internal hemorrhoids were found during retroflexion.      No additional abnormalities were found on retroflexion. Impression:            - The rectum, sigmoid colon, descending colon,                         transverse colon, ascending colon and cecum are normal.                        - Non-bleeding internal hemorrhoids.                        - No specimens collected. Recommendation:        - Discharge patient to home.                        -  Resume previous diet.                        - Continue present medications.                        - Repeat colonoscopy in 10 years for screening                         purposes.                        - Return to primary care physician as previously                         scheduled.                        - The findings and recommendations were discussed with                         the patient.                        - The findings and recommendations were discussed with                         the patient's family.                        - In the future, if patient develops new symptoms such                          as blood per rectum, abdominal pain, weight loss,                         altered bowel habits or any other reason for concern,                         patient should discuss this with thier PCP as they may                         need a GI referral at that time or evaluation for need                         for colonoscopy earlier than the recommended screening                         colonoscopy.                        In addition, if patient's family history of colon                         cancer changes (no family history at this time) in the                         future, earlier screening may be indicated and patient  should discuss this with PCP as well.                        - High fiber diet. Procedure Code(s):     --- Professional ---                        [phone], Colonoscopy, flexible; diagnostic, including                         collection of specimen(s) by brushing or washing, when                         performed (separate procedure) Diagnosis Code(s):     --- Professional ---                        Z12.11, Encounter for screening for malignant neoplasm                         of colon CPT copyright 2019 American Medical Association. All rights reserved. The codes documented in this report are preliminary and upon coder review may  be revised to meet current compliance requirements.  Vonda Antigua, MD Margretta Sidle B. Bonna Gains MD, MD 12/11/2020 8:47:30 AM This report has been signed electronically. Number of Addenda: 0 Note Initiated On: 12/11/2020 7:11 AM Scope Withdrawal Time: 0 hours 16 minutes 27 seconds  Total Procedure Duration: 0 hours 20 minutes 35 seconds       [facility]

## 2020-12-11 NOTE — Anesthesia Preprocedure Evaluation (Signed)
Anesthesia Evaluation  Patient identified by MRN, date of birth, ID band Patient awake    Reviewed: Allergy & Precautions, H&P , NPO status , Patient's Chart, lab work & pertinent test results  Airway Mallampati: II  TM Distance: >3 FB     Dental no notable dental hx.    Pulmonary sleep apnea and Continuous Positive Airway Pressure Ventilation ,    breath sounds clear to auscultation       Cardiovascular negative cardio ROS Normal cardiovascular exam     Neuro/Psych PSYCHIATRIC DISORDERS Depression negative neurological ROS     GI/Hepatic negative GI ROS, Neg liver ROS,   Endo/Other  negative endocrine ROS  Renal/GU negative Renal ROS     Musculoskeletal negative musculoskeletal ROS (+)   Abdominal   Peds  Hematology negative hematology ROS (+)   Anesthesia Other Findings   Reproductive/Obstetrics                             Anesthesia Physical Anesthesia Plan  ASA: II  Anesthesia Plan: General   Post-op Pain Management:    Induction: Intravenous  PONV Risk Score and Plan: TIVA  Airway Management Planned: Mask  Additional Equipment:   Intra-op Plan:   Post-operative Plan:   Informed Consent: I have reviewed the patients History and Physical, chart, labs and discussed the procedure including the risks, benefits and alternatives for the proposed anesthesia with the patient or authorized representative who has indicated his/her understanding and acceptance.       Plan Discussed with: CRNA, Anesthesiologist and Surgeon  Anesthesia Plan Comments:         Anesthesia Quick Evaluation

## 2020-12-11 NOTE — Transfer of Care (Signed)
Immediate Anesthesia Transfer of Care Note  Patient: Julie Lewis  Procedure(s) Performed: COLONOSCOPY WITH PROPOFOL (N/A )  Patient Location: PACU and Endoscopy Unit  Anesthesia Type:General  Level of Consciousness: awake, alert  and oriented  Airway & Oxygen Therapy: Patient Spontanous Breathing  Post-op Assessment: Report given to RN and Post -op Vital signs reviewed and stable  Post vital signs: Reviewed and stable  Last Vitals:  Vitals Value Taken Time  BP 128/81 12/11/20 0845  Temp    Pulse 59 12/11/20 0845  Resp 16 12/11/20 0845  SpO2 100 % 12/11/20 0845  Vitals shown include unvalidated device data.  Last Pain:  Vitals:   12/11/20 0744  TempSrc: Temporal  PainSc: 0-No pain         Complications: No complications documented.

## 2020-12-11 NOTE — H&P (Signed)
Vonda Antigua, MD [address] [place], [place], [place], [address] [address], [place], [place], [place], [address] Phone: [phone]  Fax: [phone]  Primary Care Physician:  Denita Lung, DO   Pre-Procedure History & Physical: HPI:  Julie Lewis is a 64 y.o. female is here for a colonoscopy.   Past Medical History:  Diagnosis Date  . Cancer ([place])    melanoma  . Depression   . 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 Tremors of nervous system     Past Surgical History:  Procedure Laterality Date  . COLONOSCOPY    . finger surgica;    . GALLBLADDER SURGERY    . hemmorriod sugery    . LEG SURGERY    . OVARIAN CYST REMOVAL      Prior to Admission medications   Medication Sig Start Date End Date Taking? Authorizing Provider  atorvastatin (LIPITOR) 40 MG tablet Take 40 mg by mouth daily.   Yes [provider]  citalopram (CELEXA) 10 MG tablet Take 10 mg by mouth daily.   Yes [provider]  Flaxseed Oil (LINSEED OIL) OIL Use 1,000 mg every morning   Yes [provider]  gabapentin (NEURONTIN) 300 MG capsule Take 300 mg by mouth 3 (three) times daily.   Yes [provider]  MELOXICAM PO Take by mouth.   Yes [provider]  primidone (MYSOLINE) 250 MG tablet Take 250 mg by mouth daily.   Yes [provider]  propranolol ER (INDERAL LA) 120 MG 24 hr capsule Take 120 mg by mouth daily.   Yes [provider]  Resveratrol 100 MG CAPS Take 1 tablet by mouth every morning.   Yes [provider]  TRAMADOL HCL PO Take by mouth.   Yes [provider]  valACYclovir (VALTREX) 500 MG tablet Take 500 mg by mouth 2 (two) times daily.    [provider]    Allergies as of 11/19/2020 - Review Complete 10/04/2020  Allergen Reaction Noted  . Bupropion Other (See Comments) 02/11/2018  . Erythromycin  08/17/2018  . Iodinated diagnostic agents Rash 02/11/2018  . Penicillins Rash  02/11/2018    Family History  Problem Relation Age of Onset  . Heart failure Mother   . Diabetes Mother   . Hypertension Father   . Hypertension Brother   . Breast cancer Cousin     Social History   Socioeconomic History  . Marital status: Married    Spouse name: Not on file  . Number of children: Not on file  . Years of education: Not on file  . Highest education level: Not on file  Occupational History  . Not on file  Tobacco Use  . Smoking status: Never Smoker  . Smokeless tobacco: Never Used  Vaping Use  . Vaping Use: Never used  Substance and Sexual Activity  . Alcohol use: Yes    Comment: occas  . Drug use: No  . Sexual activity: Never    Birth control/protection: Post-menopausal  Other Topics Concern  . Not on file  Social History Narrative  . Not on file   Social Determinants of Health   Financial Resource Strain: Not on file  Food Insecurity: Not on file  Transportation Needs: Not on file  Physical Activity: Not on file  Stress: Not on file  Social Connections: Not on file  Intimate Partner Violence: Not on file    Review of Systems: See HPI, otherwise negative ROS  Physical Exam: BP Marland Kitchen)  153/102   Pulse (!) 53   Temp (!) 97.3 F (36.3 C) (Temporal)   Resp 18   Ht 5\' 5"  (1.651 m)   Wt 73.9 kg   LMP 08/03/2020   SpO2 99%   BMI 27.12 kg/m  General:   Alert,  pleasant and cooperative in NAD Head:  Normocephalic and atraumatic. Neck:  Supple; no masses or thyromegaly. Lungs:  Clear throughout to auscultation, normal respiratory effort.    Heart:  +S1, +S2, Regular rate and rhythm, No edema. Abdomen:  Soft, nontender and nondistended. Normal bowel sounds, without guarding, and without rebound.   Neurologic:  Alert and  oriented x4;  grossly normal neurologically.  Impression/Plan: Julie Lewis is here for a colonoscopy to be performed for average risk screening.  Risks, benefits, limitations, and alternatives regarding  colonoscopy  have been reviewed with the patient.  Questions have been answered.  All parties agreeable.   Virgel Manifold, MD  12/11/2020, 8:08 AM

## 2020-12-11 NOTE — Anesthesia Procedure Notes (Signed)
Procedure Name: MAC Date/Time: 12/11/2020 8:22 AM Performed by: Lily Peer, Zen Cedillos, CRNA Pre-anesthesia Checklist: Patient identified, Suction available, Emergency Drugs available, Patient being monitored and Timeout performed Patient Re-evaluated:Patient Re-evaluated prior to induction Oxygen Delivery Method: Nasal cannula Induction Type: IV induction

## 2020-12-11 NOTE — Anesthesia Postprocedure Evaluation (Signed)
Anesthesia Post Note  Patient: Julie Lewis  Procedure(s) Performed: COLONOSCOPY WITH PROPOFOL (N/A )  Patient location during evaluation: Phase II Anesthesia Type: General Level of consciousness: awake and alert Pain management: pain level controlled Vital Signs Assessment: post-procedure vital signs reviewed and stable Respiratory status: spontaneous breathing, nonlabored ventilation and respiratory function stable Cardiovascular status: blood pressure returned to baseline and stable Postop Assessment: no apparent nausea or vomiting Anesthetic complications: no   No complications documented.   Last Vitals:  Vitals:   12/11/20 0904 12/11/20 0914  BP: (!) 161/91 (!) 177/99  Pulse: (!) 55 (!) 53  Resp: 13 11  Temp:    SpO2: 99% 100%    Last Pain:  Vitals:   12/11/20 0914  TempSrc:   PainSc: 0-No pain                 Phill Mutter

## 2020-12-11 NOTE — Progress Notes (Signed)
   12/11/20 0740  Clinical Encounter Type  Visited With Family  Visit Type Initial  Referral From Chaplain  Consult/Referral To Chaplain  While rounding SDS waiting area, chaplain briefly spoke with Pt's husband. He was looking for Pt's reference # so he could follow where she was in the procedure. Chaplain tried to get that number, but was unable get a number, but did get information for him. She was told Pt procedure would take approximately 3 hrs and Pt's husband was satisfied with information. Chaplain also told him he would receive a call when Pt was done.

## 2020-12-12 ENCOUNTER — Encounter: Payer: Self-pay | Admitting: Gastroenterology

## 2021-01-02 DIAGNOSIS — I21A1 Myocardial infarction type 2: Secondary | ICD-10-CM | POA: Insufficient documentation

## 2021-01-02 DIAGNOSIS — I10 Essential (primary) hypertension: Secondary | ICD-10-CM | POA: Insufficient documentation

## 2021-01-08 ENCOUNTER — Telehealth: Payer: Self-pay | Admitting: *Deleted

## 2021-01-08 NOTE — Telephone Encounter (Signed)
Pt's sister Casimiro Needle) is a pt of Dr. 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 Neck/chest serve pain and was transferred to [facility] with serve neck chest pain.  Was admitted. Her Troponin levels are hsTroponin First one 131, Second one 371, Third one 226,   Delta hsTroponin:240  ... She has reached out to her doctor in [place] for  Cardiologist but has heard nothing back.  With heart attack history in our family she is desperate.  Lives in [place] and wants local doctor for treatment either [place] or [place].  Hope you can get her in for 1st appointment as your patient with a readily recommendation for available Cardiologist.  Thank you.  I had her permission to contact you she has been contacting me thru the night last nigh and every since her incident.  Hope you can help her.  Her phone is [phone], Address [phone], [place] [phone].... Much appreciation.Marland KitchenMarland KitchenEllsworth

## 2021-01-08 NOTE — Telephone Encounter (Signed)
Pt called in trying to get a new pt appt with Dr. Damita Dunnings asap to discuss getting a cardiology referral ASAP. Per Dr. Damita Dunnings a cardiology referral should be pending and just schedule new pt appt when able. Pt advise note will be routed to our [facility] and they will call her to discuss cardiology referral and new pt appt scheduled with Dr. Damita Dunnings on 01/24/21  FYI to PCP and also routed to our PCCs so they can f/u with pt regarding cardiology referral per Dr. Trevor Iha with Elige Ko., RN and she is aware of situation so phone note sent to her also

## 2021-01-09 NOTE — Telephone Encounter (Signed)
It appears that the main issue is getting her set up with cardiology.  Since there has already been contact with the primary doc about setting up a cardiologist closer to home, I think it makes sense to let them proceed with that.  That way all of her old records can get packaged and sent to cardiology per the usual routine.  I think that is the best option for now and I will await to hear that outcome.  I still think it makes sense to schedule her as a new patient when possible but I did not put in a new referral yet.  Thanks.

## 2021-01-09 NOTE — Telephone Encounter (Signed)
Spoke with patient and advised her that she has an appointment scheduled with Cardiologist at [facility] in February. Patient stated she wanted to see someone locally [place] or [place]. She saw her current PCP Dr Caryl Comes and advised him of this and he was suppose to have called locall cardiologist to get patient scheduled but she has not heard anything yet. I advised patient that I would call her current PCP and see if I can find any extra information and will also ask if Dr Damita Dunnings can put a referral in to a cardiologist or not till we see her as a patient.  I called Dr Olin Pia office and had to leave a message with information and waiting to see if they will call me back.  Dr Damita Dunnings, not sure if you can put referral in without her been established here. Not sure if we can help further until she is a patient here. Waiting to see if Dr Olin Pia office will call me back.

## 2021-01-10 ENCOUNTER — Other Ambulatory Visit (HOSPITAL_COMMUNITY): Payer: Self-pay | Admitting: Internal Medicine

## 2021-01-10 DIAGNOSIS — R079 Chest pain, unspecified: Secondary | ICD-10-CM

## 2021-01-10 NOTE — Telephone Encounter (Signed)
Received call back from patient's PCP office, Janelle called and stated they did send over order to [facility] for stress echocardiogram for patient and that office would call and schedule with patient. I looked in Epic and I do see the order and that patient is scheduled with [facility] for the test on 01/29/21. Patient has New patient appointment with Dr Damita Dunnings on 01/24/21.  If we have any other questions for Janelle or that provider the number is [phone] ext [phone].  Nothing further is needed at this time. FYI to Dr Damita Dunnings

## 2021-01-11 NOTE — Telephone Encounter (Signed)
Noted. Thanks.

## 2021-01-24 ENCOUNTER — Ambulatory Visit (INDEPENDENT_AMBULATORY_CARE_PROVIDER_SITE_OTHER): Payer: Medicare HMO | Admitting: Family Medicine

## 2021-01-24 ENCOUNTER — Telehealth (HOSPITAL_COMMUNITY): Payer: Self-pay

## 2021-01-24 ENCOUNTER — Encounter: Payer: Self-pay | Admitting: Family Medicine

## 2021-01-24 ENCOUNTER — Other Ambulatory Visit: Payer: Self-pay

## 2021-01-24 VITALS — BP 116/78 | HR 60 | Temp 97.4°F | Ht 65.0 in | Wt 160.0 lb

## 2021-01-24 DIAGNOSIS — Z7189 Other specified counseling: Secondary | ICD-10-CM | POA: Diagnosis not present

## 2021-01-24 DIAGNOSIS — F32A Depression, unspecified: Secondary | ICD-10-CM | POA: Diagnosis not present

## 2021-01-24 DIAGNOSIS — G8929 Other chronic pain: Secondary | ICD-10-CM

## 2021-01-24 DIAGNOSIS — R778 Other specified abnormalities of plasma proteins: Secondary | ICD-10-CM | POA: Diagnosis not present

## 2021-01-24 DIAGNOSIS — M25572 Pain in left ankle and joints of left foot: Secondary | ICD-10-CM

## 2021-01-24 DIAGNOSIS — G250 Essential tremor: Secondary | ICD-10-CM

## 2021-01-24 MED ORDER — MELOXICAM 15 MG PO TBDP: 15.0000 mg | ORAL_TABLET | Freq: Every day | ORAL | Status: DC | PRN

## 2021-01-24 MED ORDER — VALACYCLOVIR HCL 500 MG PO TABS: 500.0000 mg | ORAL_TABLET | Freq: Two times a day (BID) | ORAL | Status: DC | PRN

## 2021-01-24 MED ORDER — MELOXICAM 7.5 MG PO TABS: 7.5000 mg | ORAL_TABLET | Freq: Every day | ORAL | Status: AC | PRN

## 2021-01-24 MED ORDER — GABAPENTIN 300 MG PO CAPS: 300.0000 mg | ORAL_CAPSULE | Freq: Two times a day (BID) | ORAL | Status: DC

## 2021-01-24 MED ORDER — PRIMIDONE 250 MG PO TABS: 250.0000 mg | ORAL_TABLET | Freq: Two times a day (BID) | ORAL | Status: DC

## 2021-01-24 MED ORDER — TRAMADOL HCL 50 MG PO TABS: 50.0000 mg | ORAL_TABLET | Freq: Two times a day (BID) | ORAL | Status: AC | PRN

## 2021-01-24 MED ORDER — PROPRANOLOL HCL 40 MG PO TABS: ORAL_TABLET | ORAL | Status: DC
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
## 2021-01-24 NOTE — Patient Instructions (Signed)
Ask the dental clinic if they can minimize epinephrine.  Tell them you have this event.   I'll await the cardiology notes.  Take care.  Glad to see you.

## 2021-01-25 ENCOUNTER — Other Ambulatory Visit (HOSPITAL_COMMUNITY)
Admission: RE | Admit: 2021-01-25 | Discharge: 2021-01-25 | Disposition: A | Discharge status: Home / Self Care | Payer: Medicare HMO | Source: Ambulatory Visit | Attending: Internal Medicine | Admitting: Internal Medicine

## 2021-01-25 DIAGNOSIS — Z01812 Encounter for preprocedural laboratory examination: Secondary | ICD-10-CM | POA: Insufficient documentation

## 2021-01-25 DIAGNOSIS — Z20822 Contact with and (suspected) exposure to covid-19: Secondary | ICD-10-CM | POA: Insufficient documentation

## 2021-01-26 LAB — SARS CORONAVIRUS 2 (TAT 6-24 HRS): SARS Coronavirus 2: NEGATIVE

## 2021-01-27 ENCOUNTER — Encounter: Payer: Self-pay | Admitting: Family Medicine

## 2021-01-28 ENCOUNTER — Telehealth (HOSPITAL_COMMUNITY): Payer: Self-pay | Admitting: *Deleted

## 2021-01-28 ENCOUNTER — Telehealth: Payer: Self-pay

## 2021-01-28 DIAGNOSIS — Z7189 Other specified counseling: Secondary | ICD-10-CM | POA: Insufficient documentation

## 2021-01-28 DIAGNOSIS — R778 Other specified abnormalities of plasma proteins: Secondary | ICD-10-CM | POA: Insufficient documentation

## 2021-01-28 NOTE — Telephone Encounter (Signed)
Left message for cardiology to call back

## 2021-01-28 NOTE — Telephone Encounter (Signed)
-----   Message from Tonia Ghent, MD sent at 01/28/2021 11:19 AM EST ----- Regarding: RE: Please check with cardiology/stress test site Check with cards.  I couldn't get a message to go through to cardiology.  I tried but couldn't get it done.   Thanks.    [name]  ----- Message ----- From: Filbert Berthold, CMA Sent: 01/28/2021  10:56 AM EST To: Tonia Ghent, MD Subject: FW: Please check with cardiology/stress test#  Looks like Dr. Denita Lung ordered the stress test. Do you want me to call his office to see if she is supposed to hold her beta blocker or call the patient to see if they gave her instructions? ----- Message ----- From: Tonia Ghent, MD Sent: 01/28/2021  10:34 AM EST To: Filbert Berthold, CMA Subject: RE: Please check with cardiology/stress test#  My bad.   Julie Lewis, Julie Lewis [[id number]]  Either I need to start putting the names in the notes or you'll have to start reading my mind.    [name]  ----- Message ----- From: Filbert Berthold, CMA Sent: 01/28/2021  10:31 AM EST To: Tonia Ghent, MD Subject: FW: Please check with cardiology/stress test#  Which patient is this for? ----- Message ----- From: Tonia Ghent, MD Sent: 01/28/2021  12:07 AM EST To: Filbert Berthold, CMA Subject: Please check with cardiology/stress test site  I cannot see which doctor ordered the stress test to contact them directly. Does the patient need to hold her beta-blocker prior to stress test?  [name]

## 2021-01-28 NOTE — Assessment & Plan Note (Signed)
Associated with epinephrine injection prior to dental procedure. She has cardiology follow-up pending. I will check with cardiology if she will need to alter her beta-blocker usage/dose prior to stress echo. She not having chest pain or shortness of breath. We talked about the pathophysiology of epinephrine induced demand ischemia. Okay for outpatient follow-up.

## 2021-01-28 NOTE — Telephone Encounter (Signed)
Patient given instructions for stress Echo.  Julie Lewis

## 2021-01-28 NOTE — Assessment & Plan Note (Signed)
Husband designated if patient were incapacitated. 

## 2021-01-28 NOTE — Assessment & Plan Note (Signed)
-

## 2021-01-28 NOTE — Assessment & Plan Note (Signed)
Would continue beta-blocker.

## 2021-01-28 NOTE — Assessment & Plan Note (Signed)
Continue as needed tramadol meloxicam.

## 2021-01-29 ENCOUNTER — Ambulatory Visit (HOSPITAL_COMMUNITY): Payer: Medicare HMO | Attending: Internal Medicine

## 2021-01-29 ENCOUNTER — Ambulatory Visit (HOSPITAL_COMMUNITY): Payer: Medicare HMO

## 2021-01-29 ENCOUNTER — Other Ambulatory Visit: Payer: Self-pay

## 2021-01-29 DIAGNOSIS — R079 Chest pain, unspecified: Secondary | ICD-10-CM

## 2021-01-30 LAB — ECHOCARDIOGRAM STRESS TEST
Area-P 1/2: 3.12 cm2
S' Lateral: 2.6 cm

## 2021-01-30 NOTE — Telephone Encounter (Signed)
Patient had stress test done yesterday; cardiology never called back. Closing note.

## 2021-09-23 NOTE — Progress Notes (Signed)
ANNUAL PREVENTATIVE CARE GYNECOLOGY  ENCOUNTER NOTE  Subjective:       Julie Lewis is a 65 y.o. G0P0000 postmenopausal female here for a routine annual gynecologic exam. The patient is not sexually active. The patient is not taking hormone replacement therapy. Patient denies post-menopausal bleeding. The patient wears seatbelts: yes. The patient participates in regular exercise: no. Has the patient ever been transfused or tattooed?: no. The patient reports that there is not domestic violence in her life.  Julie Lewis desires to note the following today: 1.  Reports that she is changing medications for her tremors.     Gynecologic History Patient's last menstrual period was 08/03/2020. Contraception: post menopausal status Last Pap: 08/17/2018. Results were: normal.  H/o abnormal pap x 2 in the past, requiring laser ablation.  Last mammogram: 11/19/2020. Results were: normal Last Colonoscopy: 12/11/2020. Results were: normal. To repeat in 10 years.  Last Dexa Scan: Ordered PCP - Dr. 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    Family History  Problem Relation Age of Onset   Heart failure Mother    Diabetes Mother    Hypertension Father    Heart failure Father    Tremor Sister        Multiple family members with tremor.   Heart attack Sister    Hypertension Brother    Breast cancer Cousin    Colon cancer Neg Hx     Past Surgical History:  Procedure Laterality Date   COLONOSCOPY     COLONOSCOPY WITH PROPOFOL N/A 12/11/2020   Procedure: COLONOSCOPY WITH PROPOFOL;  Surgeon: Virgel Manifold, MD;   Location: [facility] ENDOSCOPY;  Service: Endoscopy;  Laterality: N/A;   GALLBLADDER SURGERY     hemmorriod sugery     LEG SURGERY     MELANOMA EXCISION     OVARIAN CYST REMOVAL     Pyogenic granuloma excision      Social History   Socioeconomic History   Marital status: Married    Spouse name: Not on file   Number of children: Not on file   Years of education: Not on file   Highest education level: Not on file  Occupational History   Not on file  Tobacco Use   Smoking status: Never   Smokeless tobacco: Never  Vaping Use   Vaping Use: Never used  Substance and Sexual Activity   Alcohol use: Yes    Comment: occas   Drug use: No   Sexual activity: Never    Birth control/protection: Post-menopausal  Other Topics Concern   Not on file  Social History Narrative   Married 1983.   Attended [employer] in [place].   Left handed   Social Determinants of Health   Financial Resource Strain: Not on file  Food Insecurity: Not on file  Transportation Needs: Not on file  Physical Activity: Not on file  Stress: Not on file  Social Connections: Not on file  Intimate Partner Violence: Not on  file    Current Outpatient Medications on File Prior to Visit  Medication Sig Dispense Refill   atorvastatin (LIPITOR) 40 MG tablet Take 40 mg by mouth daily.     B Complex Vitamins (VITAMIN B COMPLEX PO) Take by mouth.     citalopram (CELEXA) 10 MG tablet Take 10 mg by mouth daily.     gabapentin (NEURONTIN) 300 MG capsule Take 1 capsule (300 mg total) by mouth 2 (two) times daily.     hydrocortisone 2.5 % cream Apply to right eyelid and lips twice a day for 2 weeks     meloxicam (MOBIC) 7.5 MG tablet Take 1 tablet (7.5 mg total) by mouth daily as needed for pain.     Multiple Vitamins-Minerals (MULTIVITAMIN GUMMIES ADULT PO) Take by mouth.     primidone (MYSOLINE) 250 MG tablet Take 1 tablet (250 mg total) by mouth in the morning and at bedtime.     propranolol (INDERAL) 40 MG tablet  Take 120 mg in the AM and 80-120 mg in the PM     Resveratrol 100 MG CAPS Take 1 tablet by mouth every morning.     tacrolimus (PROTOPIC) 0.1 % ointment Apply to right eyelid daily after finishing hydrocortisone     topiramate (TOPAMAX) 25 MG tablet Take by mouth.     traMADol (ULTRAM) 50 MG tablet Take 1 tablet (50 mg total) by mouth every 12 (twelve) hours as needed.     valACYclovir (VALTREX) 500 MG tablet Take 1 tablet (500 mg total) by mouth 2 (two) times daily as needed.     Flaxseed Oil (LINSEED OIL) OIL Use 1,000 mg every morning (Patient not taking: Reported on 09/24/2021)     No current facility-administered medications on file prior to visit.    Allergies  Allergen Reactions   Bupropion Other (See Comments)    myoclonus   Erythromycin     Severe stomach issues    Epinephrine Other (See Comments)    Chest pain. Intolerance to epinephrine   Iodinated Diagnostic Agents Rash    Contrast dye   Penicillins Rash      Review of Systems ROS Review of Systems - General ROS: negative for - chills, fatigue, fever, hot flashes, night sweats, weight gain or weight loss Psychological ROS: negative for - anxiety, decreased libido, depression, mood swings, physical abuse or sexual abuse Ophthalmic ROS: negative for - blurry vision, eye pain or loss of vision ENT ROS: negative for - headaches, hearing change, visual changes or vocal changes Allergy and Immunology ROS: negative for - hives, itchy/watery eyes or seasonal allergies Hematological and Lymphatic ROS: negative for - bleeding problems, bruising, swollen lymph nodes or weight loss Endocrine ROS: negative for - galactorrhea, hair pattern changes, hot flashes, malaise/lethargy, mood swings, palpitations, polydipsia/polyuria, skin changes, temperature intolerance or unexpected weight changes Breast ROS: negative for - new or changing breast lumps or nipple discharge Respiratory ROS: negative for - cough or shortness of  breath Cardiovascular ROS: negative for - chest pain, irregular heartbeat, palpitations or shortness of breath Gastrointestinal ROS: no abdominal pain, change in bowel habits, or black or bloody stools Genito-Urinary ROS: no dysuria, trouble voiding, or hematuria Musculoskeletal ROS: negative for - joint pain or joint stiffness Neurological ROS: negative for - bowel and bladder control changes.  Dermatological ROS: negative for rash and skin changes.  Psychologic: negative for - anxiety, depression, irritability, memory difficulties, or mood swings  Objective:   BP (!) 152/96   Pulse (!) 51  Resp 16   Ht 5\' 5"  (1.651 m)   Wt 162 lb 8 oz (73.7 kg)   LMP 08/03/2020   BMI 27.04 kg/m  CONSTITUTIONAL: Well-developed, well-nourished female in no acute distress. Overweight PSYCHIATRIC: Normal mood and affect. Normal behavior. Normal judgment and thought content. [place]: Alert and oriented to person, place, and time. Normal muscle tone coordination. No cranial nerve deficit noted. HENT:  Normocephalic, atraumatic, External right and left ear normal. Oropharynx is clear and moist EYES: Conjunctivae and EOM are normal. Pupils are equal, round, and reactive to light. No scleral icterus.  NECK: Normal range of motion, supple, no masses.  Normal thyroid.  SKIN: Skin is warm and dry. No rash noted. Not diaphoretic. No erythema. No pallor.  CARDIOVASCULAR: Normal heart rate noted, regular rhythm, no murmur. RESPIRATORY: Clear to auscultation bilaterally. Effort and breath sounds normal, no problems with respiration noted. BREASTS: Symmetric in size. No masses, skin changes, nipple drainage, or lymphadenopathy. ABDOMEN: Soft, normal bowel sounds, no distention noted.  No tenderness, rebound or guarding.  BLADDER: Normal PELVIC:  Bladder no bladder distension noted  Urethra: normal appearing urethra with no masses, tenderness or lesions  Vulva: normal appearing vulva with no masses, tenderness  or lesions  Vagina: normal appearing vagina with mild vaginal atrophy, small amount of thin white discharge, no lesions  Cervix: normal appearing cervix without discharge or lesions  Uterus: uterus is normal size, shape, consistency and nontender  Adnexa: normal adnexa in size, nontender and no masses  RV: External Exam NormaI, No Rectal Masses and Normal Sphincter tone  MUSCULOSKELETAL: Normal range of motion. No tenderness.  No cyanosis, clubbing, or edema.  2+ distal pulses. LYMPHATIC: No Axillary, Supraclavicular, or Inguinal Adenopathy.    Labs: No new labs  Assessment:   1. Well woman exam with routine gynecological exam   2. Postmenopausal   3. Osteoporosis screening   4. Breast cancer screening by mammogram   5. Needs flu shot   6. Vaginal atrophy     Plan:  - Pap: Not done. Patient is up to date. No further pap smears needed, age based screening completed.  - Mammogram: Ordered - Stool Guaiac Testing:   - Labs:  None ordered. Patient routinely has labs done by PCP   - Dexa Scan: Ordered.  Will perform this year.  - Routine preventative health maintenance measures emphasized: Exercise/Diet/Weight control, Alcohol/Substance use risks and Stress Management  - Mild vaginal atrophy, asymptomatic.   -Patient has completed Covid vaccination series Therapist, music) and booster. Inquires about new vaccination.  - Flu vaccine given today.  - Return to clinic in 1 year for annual exam.     Rubie Maid, MD [facility] [facility]

## 2021-09-23 NOTE — Patient Instructions (Addendum)
Preventive Care 65-64 Years Old, Female Preventive care refers to lifestyle choices and visits with your health care provider that can promote health and wellness. This includes: A yearly physical exam. This is also called an annual wellness visit. Regular dental and eye exams. Immunizations. Screening for certain conditions. Healthy lifestyle choices, such as: Eating a healthy diet. Getting regular exercise. Not using drugs or products that contain nicotine and tobacco. Limiting alcohol use. What can I expect for my preventive care visit? Physical exam Your health care provider will check your: Height and weight. These may be used to calculate your BMI (body mass index). BMI is a measurement that tells if you are at a healthy weight. Heart rate and blood pressure. Body temperature. Skin for abnormal spots. Counseling Your health care provider may ask you questions about your: Past medical problems. Family's medical history. Alcohol, tobacco, and drug use. Emotional well-being. Home life and relationship well-being. Sexual activity. Diet, exercise, and sleep habits. Work and work environment. Access to firearms. Method of birth control. Menstrual cycle. Pregnancy history. What immunizations do I need? Vaccines are usually given at various ages, according to a schedule. Your health care provider will recommend vaccines for you based on your age, medical history, and lifestyle or other factors, such as travel or where you work. What tests do I need? Blood tests Lipid and cholesterol levels. These may be checked every 5 years, or more often if you are over 65 years old. Hepatitis C test. Hepatitis B test. Screening Lung cancer screening. You may have this screening every year starting at age 65 if you have a 30-pack-year history of smoking and currently smoke or have quit within the past 15 years. Colorectal cancer screening. All adults should have this screening starting at  age 65 and continuing until age 75. Your health care provider may recommend screening at age 45 if you are at increased risk. You will have tests every 1-10 years, depending on your results and the type of screening test. Diabetes screening. This is done by checking your blood sugar (glucose) after you have not eaten for a while (fasting). You may have this done every 1-3 years. Mammogram. This may be done every 1-2 years. Talk with your health care provider about when you should start having regular mammograms. This may depend on whether you have a family history of breast cancer. BRCA-related cancer screening. This may be done if you have a family history of breast, ovarian, tubal, or peritoneal cancers. Pelvic exam and Pap test. This may be done every 3 years starting at age 65. Starting at age 65, this may be done every 5 years if you have a Pap test in combination with an HPV test. Other tests STD (sexually transmitted disease) testing, if you are at risk. Bone density scan. This is done to screen for osteoporosis. You may have this scan if you are at high risk for osteoporosis. Talk with your health care provider about your test results, treatment options, and if necessary, the need for more tests. Follow these instructions at home: Eating and drinking  Eat a diet that includes fresh fruits and vegetables, whole grains, lean protein, and low-fat dairy products. Take vitamin and mineral supplements as recommended by your health care provider. Do not drink alcohol if: Your health care provider tells you not to drink. You are pregnant, may be pregnant, or are planning to become pregnant. If you drink alcohol: Limit how much you have to 0-1 drink a day. Be   aware of how much alcohol is in your drink. In the [place] one drink equals one 12 oz bottle of beer (355 mL), one 5 oz glass of wine (148 mL), or one 1 oz glass of hard liquor (44 mL). Lifestyle Take daily care of your teeth and  gums. Brush your teeth every morning and night with fluoride toothpaste. Floss one time each day. Stay active. Exercise for at least 30 minutes 5 or more days each week. Do not use any products that contain nicotine or tobacco, such as cigarettes, e-cigarettes, and chewing tobacco. If you need help quitting, ask your health care provider. Do not use drugs. If you are sexually active, practice safe sex. Use a condom or other form of protection to prevent STIs (sexually transmitted infections). If you do not wish to become pregnant, use a form of birth control. If you plan to become pregnant, see your health care provider for a prepregnancy visit. If told by your health care provider, take low-dose aspirin daily starting at age 65. Find healthy ways to cope with stress, such as: Meditation, yoga, or listening to music. Journaling. Talking to a trusted person. Spending time with friends and family. Safety Always wear your seat belt while driving or riding in a vehicle. Do not drive: If you have been drinking alcohol. Do not ride with someone who has been drinking. When you are tired or distracted. While texting. Wear a helmet and other protective equipment during sports activities. If you have firearms in your house, make sure you follow all gun safety procedures. What's next? Visit your health care provider once a year for an annual wellness visit. Ask your health care provider how often you should have your eyes and teeth checked. Stay up to date on all vaccines. This information is not intended to replace advice given to you by your health care provider. Make sure you discuss any questions you have with your health care provider. Document Revised: 02/15/2021 Document Reviewed: 08/19/2018 Elsevier Patient Education  2022 [place] Breast self-awareness means being familiar with how your breasts look and feel. It involves checking your breasts regularly and  reporting any changes to your health care provider. Practicing breast self-awareness is important. Sometimes changes may not be harmful (are benign), but sometimes a change in your breasts can be a sign of a serious medical problem. It is important to learn how to do this procedure correctly so that you can catch problems early, when treatment is more likely to be successful. All women should practice breast self-awareness, including women who have had breast implants. What you need: A mirror. A well-lit room. How to do a breast self-exam A breast self-exam is one way to learn what is normal for your breasts and whether your breasts are changing. To do a breast self-exam: Look for changes  Remove all the clothing above your waist. Stand in front of a mirror in a room with good lighting. Put your hands on your hips. Push your hands firmly downward. Compare your breasts in the mirror. Look for differences between them (asymmetry), such as: Differences in shape. Differences in size. Puckers, dips, and bumps in one breast and not the other. Look at each breast for changes in the skin, such as: Redness. Scaly areas. Look for changes in your nipples, such as: Discharge. Bleeding. Dimpling. Redness. A change in position. Feel for changes Carefully feel your breasts for lumps and changes. It is best to do this while lying  on your back on the floor, and again while sitting or standing in the tub or shower with soapy water on your skin. Feel each breast in the following way: Place the arm on the side of the breast you are examining above your head. Feel your breast with the other hand. Start in the nipple area and make -inch (2 cm) overlapping circles to feel your breast. Use the pads of your three middle fingers to do this. Apply light pressure, then medium pressure, then firm pressure. The light pressure will allow you to feel the tissue closest to the skin. The medium pressure will allow you  to feel the tissue that is a little deeper. The firm pressure will allow you to feel the tissue close to the ribs. Continue the overlapping circles, moving downward over the breast until you feel your ribs below your breast. Move one finger-width toward the center of the body. Continue to use the -inch (2 cm) overlapping circles to feel your breast as you move slowly up toward your collarbone. Continue the up-and-down exam using all three pressures until you reach your armpit.  Write down what you find Writing down what you find can help you remember what to discuss with your health care provider. Write down: What is normal for each breast. Any changes that you find in each breast, including: The kind of changes you find. Any pain or tenderness. Size and location of any lumps. Where you are in your menstrual cycle, if you are still menstruating. General tips and recommendations Examine your breasts every month. If you are breastfeeding, the best time to examine your breasts is after a feeding or after using a breast pump. If you menstruate, the best time to examine your breasts is 5-7 days after your period. Breasts are generally lumpier during menstrual periods, and it may be more difficult to notice changes. With time and practice, you will become more familiar with the variations in your breasts and more comfortable with the exam. Contact a health care provider if you: See a change in the shape or size of your breasts or nipples. See a change in the skin of your breast or nipples, such as a reddened or scaly area. Have unusual discharge from your nipples. Find a lump or thick area that was not there before. Have pain in your breasts. Have any concerns related to your breast health. Summary Breast self-awareness includes looking for physical changes in your breasts, as well as feeling for any changes within your breasts. Breast self-awareness should be performed in front of a mirror in  a well-lit room. You should examine your breasts every month. If you menstruate, the best time to examine your breasts is 5-7 days after your menstrual period. Let your health care provider know of any changes you notice in your breasts, including changes in size, changes on the skin, pain or tenderness, or unusual fluid from your nipples. This information is not intended to replace advice given to you by your health care provider. Make sure you discuss any questions you have with your health care provider. Document Revised: 07/27/2018 Document Reviewed: 07/27/2018 Elsevier Patient Education  [place].

## 2021-09-24 ENCOUNTER — Ambulatory Visit (INDEPENDENT_AMBULATORY_CARE_PROVIDER_SITE_OTHER): Payer: Medicare HMO | Admitting: Obstetrics and Gynecology

## 2021-09-24 ENCOUNTER — Other Ambulatory Visit: Payer: Self-pay

## 2021-09-24 ENCOUNTER — Encounter: Payer: Self-pay | Admitting: Obstetrics and Gynecology

## 2021-09-24 VITALS — BP 152/96 | HR 51 | Resp 16 | Ht 65.0 in | Wt 162.5 lb

## 2021-09-24 DIAGNOSIS — Z13820 Encounter for screening for osteoporosis: Secondary | ICD-10-CM | POA: Diagnosis not present

## 2021-09-24 DIAGNOSIS — Z780 Asymptomatic menopausal state: Secondary | ICD-10-CM

## 2021-09-24 DIAGNOSIS — Z1231 Encounter for screening mammogram for malignant neoplasm of breast: Secondary | ICD-10-CM

## 2021-09-24 DIAGNOSIS — Z01419 Encounter for gynecological examination (general) (routine) without abnormal findings: Secondary | ICD-10-CM | POA: Diagnosis not present

## 2021-09-24 DIAGNOSIS — Z23 Encounter for immunization: Secondary | ICD-10-CM

## 2021-09-24 DIAGNOSIS — N952 Postmenopausal atrophic vaginitis: Secondary | ICD-10-CM

## 2021-09-24 DIAGNOSIS — Z13220 Encounter for screening for lipoid disorders: Secondary | ICD-10-CM

## 2021-09-24 DIAGNOSIS — Z131 Encounter for screening for diabetes mellitus: Secondary | ICD-10-CM

## 2021-12-11 ENCOUNTER — Ambulatory Visit
Admission: RE | Admit: 2021-12-11 | Discharge: 2021-12-11 | Disposition: A | Discharge status: Home / Self Care | Payer: Medicare HMO | Source: Ambulatory Visit | Attending: Obstetrics and Gynecology | Admitting: Obstetrics and Gynecology

## 2021-12-11 ENCOUNTER — Encounter: Payer: Self-pay | Admitting: Obstetrics and Gynecology

## 2021-12-11 ENCOUNTER — Other Ambulatory Visit: Payer: Self-pay

## 2021-12-11 DIAGNOSIS — Z1231 Encounter for screening mammogram for malignant neoplasm of breast: Secondary | ICD-10-CM | POA: Insufficient documentation

## 2021-12-11 DIAGNOSIS — Z01419 Encounter for gynecological examination (general) (routine) without abnormal findings: Secondary | ICD-10-CM | POA: Diagnosis present

## 2021-12-11 DIAGNOSIS — Z780 Asymptomatic menopausal state: Secondary | ICD-10-CM | POA: Diagnosis present

## 2021-12-25 ENCOUNTER — Telehealth: Payer: Self-pay | Admitting: Obstetrics and Gynecology

## 2021-12-25 NOTE — Telephone Encounter (Signed)
She can come in to discuss management options with either myself or with her PCP

## 2021-12-25 NOTE — Telephone Encounter (Signed)
Pt is scheudled

## 2021-12-25 NOTE — Telephone Encounter (Signed)
Pt had a bone destiny test performed that showed osteoporosis pt is wanting to know what the next step is. Please Advise.

## 2022-01-10 ENCOUNTER — Ambulatory Visit: Payer: Medicare HMO | Admitting: Obstetrics and Gynecology

## 2022-01-10 ENCOUNTER — Encounter: Payer: Self-pay | Admitting: Obstetrics and Gynecology

## 2022-01-10 ENCOUNTER — Other Ambulatory Visit: Payer: Self-pay

## 2022-01-10 VITALS — BP 127/78 | HR 51 | Ht 65.0 in | Wt 166.0 lb

## 2022-01-10 DIAGNOSIS — M810 Age-related osteoporosis without current pathological fracture: Secondary | ICD-10-CM | POA: Diagnosis not present

## 2022-01-10 DIAGNOSIS — R5383 Other fatigue: Secondary | ICD-10-CM | POA: Diagnosis not present

## 2022-01-10 NOTE — Progress Notes (Signed)
GYNECOLOGY PROGRESS NOTE  Subjective:    Patient ID: Julie Lewis, female    DOB: 03/11/1956, 66 y.o.   MRN: [id number]  HPI  Patient is a 66 y.o. G0P0000 female who presents for discussion of DEXA scan results.  Denies any major complaints today.  She is accompanied by her husband to today's visit.  OSTEOPOROSIS risk factors (non-modifiable) Personal Hx of fracture as an adult: no Hx of fracture in first-degree relative: no Caucasian race: yes Advanced age: yes female sex: yes Dementia: no Poor health/frailty:no   OSTEOPOROSIS risk factors (potentially modifiable): Tobacco use: no Low body weight (<127 lbs): no Estrogen deficiency    early menopause (age <45) or bilateral ovariectomy: no    prolonged premenopausal amenorrhea (>1 yr): no Low calcium intake (lifelong): no Alcoholism: no Recurrent falls: no Inadequate physical activity: no  The following portions of the patient's history were reviewed and updated as appropriate: allergies, current medications, past family history, past medical history, past social history, past surgical history, and problem list.  Review of Systems A comprehensive review of systems was negative except for: Constitutional: positive for fatigue Musculoskeletal: positive for low back pain (intermittent, mild)   Objective:   Blood pressure 127/78, pulse (!) 51, height 5\' 5"  (1.651 m), weight 166 lb (75.3 kg), last menstrual period 08/03/2020, SpO2 100 %. Body mass index is 27.62 kg/m. General appearance: alert and no distress Remainder of exam deferred.    Imaging:  EXAM: DUAL X-RAY ABSORPTIOMETRY (DXA) FOR BONE MINERAL DENSITY   IMPRESSION: Your patient Julie Lewis completed a BMD test on 12/11/2021 using the [employer] (software version: 14.10) manufactured by [employer]. The following summarizes the results of our evaluation. Technologist: SCE PATIENT BIOGRAPHICAL: Name: Julie Lewis Patient  ID: [id number] Birth Date: 08/09/1956 Height: 65.0 in. Gender: Female Exam Date: 12/11/2021 Weight: 163.0 lbs. Indications: Caucasian, History of Fracture (Adult), Parent Hip Fracture, Postmenopausal Fractures: left tibia, Left wrist Treatments: DENSITOMETRY RESULTS: Site      Region    Measured Date Measured Age WHO Classification Young Adult T-score BMD         %Change vs. Previous Significant Change (*) AP Spine L1-L4 12/11/2021 65.9 Osteoporosis -2.8 0.852 g/cm2 - -   DualFemur Neck Left 12/11/2021 65.9 Osteopenia -2.1 0.749 g/cm2 - - ASSESSMENT:   The BMD measured at AP Spine L1-L4 is 0.852 g/cm2 with a T-score of -2.8. This patient is considered osteoporotic according to [facility]) criteria. The scan quality is good.   World Pharmacologist [facility]) criteria for post-menopausal, Caucasian Women: Normal:                   T-score at or above -1 SD Osteopenia/low bone mass: T-score between -1 and -2.5 SD Osteoporosis:             T-score at or below -2.5 SD   RECOMMENDATIONS:   1. All patients should optimize calcium and vitamin D intake. 2. Consider FDA-approved medical therapies in postmenopausal women and men aged 27 years and older, based on the following: a. A hip or vertebral(clinical or morphometric) fracture b. T-score < -2.5 at the femoral neck or spine after appropriate evaluation to exclude secondary causes c. Low bone mass (T-score between -1.0 and -2.5 at the femoral neck or spine) and a 10-year probability of a hip fracture > 3% or a 10-year probability of a major osteoporosis-related fracture > 20% based on the [place]-adapted WHO algorithm 3.  Clinician judgment and/or patient preferences may indicate treatment for people with 10-year fracture probabilities above or below these levels   FOLLOW-UP:   People with diagnosed cases of osteoporosis or at high risk for fracture should have regular bone mineral density tests. For patients  eligible for Medicare, routine testing is allowed once every 2 years. The testing frequency can be increased to one year for patients who have rapidly progressing disease, those who are receiving or discontinuing medical therapy to restore bone mass, or have additional risk factors.   I have reviewed this report, and agree with the above findings.   [facility]     Electronically Signed   By: Rolm Baptise M.D.   On: 12/11/2021 16:23     Assessment:   1. Age-related osteoporosis without current pathological fracture      Plan:   1. Age-related osteoporosis without current pathological fracture - Vitamin D (25 hydroxy) - Calcium - Creatinine -Discussed different treatment options for patient regarding osteoporosis.  Recommended initiation of vitamin D and calcium supplement.  Advised that pharmacological therapy is indicated based on current T score.  I discussed the different treatment options with patient.  Patient desires to think over her options.  Handout given.  Patient to notify MD once treatment option is selected. -Has questions regarding use of chiropractor for low back pain, advised that she could utilize.  2. Fatigue -Patient's husband notes that patient has been fatigued for several months.  No known cause.  It is usually very active person.  We will check vitamin levels and TSH.   A total of 20 minutes were spent face-to-face with the patient during this encounter and over half of that time dealt with counseling and coordination of care.  Rubie Maid, MD [facility]

## 2022-01-11 LAB — CALCIUM: Calcium: 9.3 mg/dL (ref 8.7–10.3)

## 2022-01-11 LAB — VITAMIN B12: Vitamin B-12: 1048 pg/mL (ref 232–1245)

## 2022-01-11 LAB — VITAMIN D 25 HYDROXY (VIT D DEFICIENCY, FRACTURES): Vit D, 25-Hydroxy: 54.3 ng/mL (ref 30.0–100.0)

## 2022-01-11 LAB — CREATININE, SERUM
Creatinine, Ser: 0.8 mg/dL (ref 0.57–1.00)
eGFR: 82 mL/min/{1.73_m2} (ref >59)

## 2022-01-11 LAB — TSH: TSH: 2.05 u[IU]/mL (ref 0.450–4.500)

## 2022-06-19 ENCOUNTER — Telehealth: Payer: Self-pay | Admitting: Family Medicine

## 2022-06-19 ENCOUNTER — Telehealth: Payer: Self-pay

## 2022-06-19 NOTE — Telephone Encounter (Signed)
Error see phone note 06/19/22.

## 2022-06-19 NOTE — Telephone Encounter (Signed)
Patient called and stated that she passed out Saturday and had blurry vision. Also today her bp reading was 162/98 and had patient triaged.

## 2022-06-19 NOTE — Telephone Encounter (Addendum)
Monica RN with access nurse said that pt is on the phone; pt passed out on 06/14/22 and today sitting BP 162/98 P ? And when pt stands pt gets dizzy,vision changes and pt feels like she is going to pass out. Access nurse disposition is ED or office triage.; there are no available appts at [facility] today and pt has hx of hypertension and MI I advised that pt should go to ED now for eval and testing. Monica agreed and will let pt know. Will attach access nurse record when available. Sending note to Dr Damita Dunnings and Janett Billow CMA. Will also teams [place].Pt last seen at [facility] 01/24/2021.

## 2022-06-20 ENCOUNTER — Ambulatory Visit (INDEPENDENT_AMBULATORY_CARE_PROVIDER_SITE_OTHER): Payer: Medicare HMO | Admitting: Internal Medicine

## 2022-06-20 ENCOUNTER — Encounter: Payer: Self-pay | Admitting: Internal Medicine

## 2022-06-20 DIAGNOSIS — R55 Syncope and collapse: Secondary | ICD-10-CM | POA: Diagnosis not present

## 2022-06-20 NOTE — Telephone Encounter (Signed)
Okay---I will check her out at the appointment

## 2022-06-20 NOTE — Patient Instructions (Signed)
I would suggest you try just '80mg'$  propranolol in the morning and '40mg'$  in evening OR transferring to '120mg'$  of the long acting (with 10-'20mg'$  of short acting for as needed use if tremor worse)

## 2022-06-20 NOTE — Progress Notes (Signed)
Subjective:    Patient ID: Julie Lewis, female    DOB: 12-28-1955, 66 y.o.   MRN: [id number]  HPI Here for dizziness  She does a lot of bending---and will get lightheaded upon standing up again Has vision changes and has to hold on A week ago---actually passed out (not sure how long) Had some initial confusion upon awakening  No chest pain No palpitations Just bought a BP cuff----lowest 96/62, highest 150's/106  No dizziness in bed No symptoms with exertion--but not very active (washes floors in shed for cats)  Current Outpatient Medications on File Prior to Visit  Medication Sig Dispense Refill   atorvastatin (LIPITOR) 40 MG tablet Take 40 mg by mouth daily.     B Complex Vitamins (VITAMIN B COMPLEX PO) Take by mouth.     citalopram (CELEXA) 10 MG tablet Take 10 mg by mouth daily.     Flaxseed Oil (LINSEED OIL) OIL      gabapentin (NEURONTIN) 300 MG capsule Take 1 capsule (300 mg total) by mouth 2 (two) times daily.     hydrocortisone 2.5 % cream Apply to right eyelid and lips twice a day for 2 weeks     meloxicam (MOBIC) 7.5 MG tablet Take 1 tablet (7.5 mg total) by mouth daily as needed for pain.     Multiple Vitamins-Minerals (MULTIVITAMIN GUMMIES ADULT PO) Take by mouth.     primidone (MYSOLINE) 250 MG tablet Take 1 tablet (250 mg total) by mouth in the morning and at bedtime.     propranolol (INDERAL) 40 MG tablet Take 120 mg in the AM and 80-120 mg in the PM     Resveratrol 100 MG CAPS Take 1 tablet by mouth every morning.     tacrolimus (PROTOPIC) 0.1 % ointment Apply to right eyelid daily after finishing hydrocortisone     traMADol (ULTRAM) 50 MG tablet Take 1 tablet (50 mg total) by mouth every 12 (twelve) hours as needed.     valACYclovir (VALTREX) 500 MG tablet Take 1 tablet (500 mg total) by mouth 2 (two) times daily as needed.     No current facility-administered medications on file prior to visit.    Allergies  Allergen Reactions   Bupropion Other (See  Comments)    myoclonus   Erythromycin     Severe stomach issues    Epinephrine Other (See Comments)    Chest pain. 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    Past Surgical History:  Procedure Laterality Date   COLONOSCOPY     COLONOSCOPY WITH PROPOFOL N/A 12/11/2020   Procedure: COLONOSCOPY WITH PROPOFOL;  Surgeon: Virgel Manifold, MD;  Location: [facility] ENDOSCOPY;  Service: Endoscopy;  Laterality: N/A;   GALLBLADDER SURGERY     hemmorriod sugery     LEG SURGERY     MELANOMA EXCISION     OVARIAN CYST REMOVAL     Pyogenic granuloma excision      Family History  Problem Relation Age of Onset   Heart failure Mother    Diabetes Mother    Hypertension Father    Heart failure  Father    Tremor Sister        Multiple family members with tremor.   Heart attack Sister    Hypertension Brother    Breast cancer Cousin    Colon cancer Neg Hx     Social History   Socioeconomic History   Marital status: Married    Spouse name: Not on file   Number of children: Not on file   Years of education: Not on file   Highest education level: Not on file  Occupational History   Not on file  Tobacco Use   Smoking status: Never    Passive exposure: Past (as a child)   Smokeless tobacco: Never  Vaping Use   Vaping Use: Never used  Substance and Sexual Activity   Alcohol use: Yes    Comment: occas   Drug use: No   Sexual activity: Never    Birth control/protection: Post-menopausal  Other Topics Concern   Not on file  Social History Narrative   Married 1983.   Attended [employer] in [place].   Left handed   Social Determinants of Health   Financial Resource Strain: Not on file   Food Insecurity: Not on file  Transportation Needs: Not on file  Physical Activity: Not on file  Stress: Not on file  Social Connections: Not on file  Intimate Partner Violence: Not on file   Review of Systems On primidone and propranolol for tremor---worse over the years Sees neurologist in [place]. Last time saw NP who tried topiramate and she had a terrible time with it    Objective:   Physical Exam Constitutional:      Appearance: Normal appearance.  Cardiovascular:     Rate and Rhythm: Normal rate and regular rhythm.     Heart sounds: No murmur heard.    No gallop.  Pulmonary:     Effort: Pulmonary effort is normal.     Breath sounds: Normal breath sounds. No wheezing or rales.  Musculoskeletal:     Cervical back: Neck supple.     Right lower leg: No edema.     Left lower leg: No edema.  Lymphadenopathy:     Cervical: No cervical adenopathy.  Neurological:     Mental Status: She is alert.            Assessment & Plan:

## 2022-06-20 NOTE — Telephone Encounter (Signed)
Agree with ER dispo, please get update on patient.  Thanks.

## 2022-06-20 NOTE — Telephone Encounter (Addendum)
I spoke with pt; pt did not go anywhere on 06/20/22 to be seen. Pt said today she has not gotten out of bed yet. Pt said BP this morning 130 something / 70 something. Pt said so far no dizziness,no vision changes, no CP or SOB. Pt said she would schedule appt in office today at 10:15 with Dr Silvio Pate just to get checked out. UC & ED precautions given and pt voiced understanding.sending note to Dr Silvio Pate and Larene Beach CMA and Juluis Rainier to Dr Damita Dunnings.   [place] [facility] - Client TELEPHONE ADVICE RECORD AccessNurse Patient Name: Julie Lewis [facility] Gender: Female DOB: 1956-03-21 Age: 66 Y 25 M 8 D Return Phone Number: [phone] (Primary) Address: City/ State/ Zip: [place] [address] Client [facility] - Client Client Site [place] - Day Provider Renford Dills - MD Contact Type Call Who Is Calling Patient / Member / Family / Caregiver Call Type Triage / Clinical Relationship To Patient Self Return Phone Number [phone] (Primary) Chief Complaint FAINTING or [place] Reason for Call Symptomatic / Request for [place] states she passed out Saturday, her blood pressure is 162/98. [place] in [place] Translation No Nurse Assessment Nurse: Roselyn Reef, RN, Brayton Layman Date/Time Eilene Ghazi Time): 06/19/2022 3:48:05 PM Confirm and document reason for call. If symptomatic, describe symptoms. ---Caller states she passed out Saturday. States her pressure had been low. Her blood pressure is 162/98 at this moment. States currently she is not feeling lightheaded or dizzy currently. States she has been dealing with the lightheadedness for about 6 months but it has gotten worse sense. When she stands up she starts to feel dizzy lightheaded. Does the patient have any new or worsening symptoms? ---Yes Will a triage be completed? ---Yes Related visit to physician within  the last 2 weeks? ---Yes Does the PT have any chronic conditions? (i.e. diabetes, asthma, this includes High risk factors for pregnancy, etc.) ---Yes List chronic conditions. ---propranolol (for tremors), depression Is this a behavioral health or substance abuse call? ---No Guidelines Guideline Title Affirmed Question Affirmed Notes Nurse Date/Time (Eastern Time) Fainting [1] Age > 50 years AND [2] now alert and feels fine Rennie Natter, [facility] 06/19/2022 3:54:05 PM PLEASE NOTE: All timestamps contained within this report are represented as [place] Standard Time. CONFIDENTIALTY NOTICE: This fax transmission is intended only for the addressee. It contains information that is legally privileged, confidential or otherwise protected from use or disclosure. If you are not the intended recipient, you are strictly prohibited from reviewing, disclosing, copying using or disseminating any of this information or taking any action in reliance on or regarding this information. If you have received this fax in error, please notify us immediately by telephone so that we can arrange for its return to [place]. Phone: [phone], Toll-Free: [phone], Fax: [phone] Page: 2 of 2 Call Id: [id number] [place]. Time Eilene Ghazi Time) Disposition Final User 06/19/2022 3:46:32 PM Send to Urgent Jeannette Corpus, Tiffany 06/19/2022 4:11:42 PM Go to ED Now (or PCP triage) Yes Roselyn Reef, RN, [facility] Final Disposition 06/19/2022 4:11:42 PM Go to ED Now (or PCP triage) Yes Roselyn Reef, RN, [place] Grandchild Disagree/Comply Comply Caller Understands Yes PreDisposition [place] Advice Given Per Guideline GO TO ED NOW (OR PCP TRIAGE): * IF NO PCP (PRIMARY CARE PROVIDER) SECOND-LEVEL TRIAGE: You need to be seen within the next hour. Go to the [facility] at _____________ [place] as soon as you can.  CALL BACK IF: * You become worse CARE ADVICE given per Fainting (Adult) guideline. Comments User: Cristi Loron, RN Date/Time Eilene Ghazi Time):  06/19/2022 4:08:00 PM called backline for PCP triage. nurse I spoke to says that she think patient should go to ED now. Referrals GO TO FACILITY OTHER - [facility]

## 2022-06-20 NOTE — Assessment & Plan Note (Signed)
Clearly having orthostatic symptoms that must be related to her propranolol----she is on a very high dose of 120AM and 80 PM of rapid acting Discussed trying to wean down if possible---or should consider long acting propranolol May be better off with more primidone (suggested 80AM, 40PM----or considering 120LA with 10 or '20mg'$  for prn use)

## 2022-10-14 ENCOUNTER — Encounter: Payer: Self-pay | Admitting: Family Medicine

## 2022-10-14 ENCOUNTER — Ambulatory Visit (INDEPENDENT_AMBULATORY_CARE_PROVIDER_SITE_OTHER): Payer: Medicare HMO | Admitting: Family Medicine

## 2022-10-14 VITALS — BP 90/60 | HR 55 | Temp 98.6°F | Ht 65.5 in | Wt 162.2 lb

## 2022-10-14 DIAGNOSIS — R051 Acute cough: Secondary | ICD-10-CM | POA: Diagnosis not present

## 2022-10-14 DIAGNOSIS — U071 COVID-19: Secondary | ICD-10-CM | POA: Insufficient documentation

## 2022-10-14 LAB — POC COVID19 BINAXNOW: SARS Coronavirus 2 Ag: POSITIVE — AB

## 2022-10-14 LAB — POC INFLUENZA A&B (BINAX/QUICKVUE)
Influenza A, POC: NEGATIVE
Influenza B, POC: NEGATIVE

## 2022-10-14 MED ORDER — MOLNUPIRAVIR EUA 200MG CAPSULE: 4.0000 | ORAL_CAPSULE | Freq: Two times a day (BID) | ORAL | 0 refills | Status: AC

## 2022-10-14 NOTE — Patient Instructions (Addendum)
Pushing fluids, rest.  Complete a course of antivirals. Follow blood pressure at home... decrease propranolol to  40 mg BID temporarily.dc

## 2022-10-14 NOTE — Assessment & Plan Note (Signed)
COVID19  Infection < 5 days from onset of symptoms in  4 x vaccinated overweight individual with history of  HTN, CAD  No clear sign of bacterial infection at this time.   No SOB.  No red flags/need for ER visit or in-person exam at respiratory clinic at this time. I am concerned about her low BP... possible dehydration but also likely SE to propranolol.  I recommended pushing fluids to keep up with diarrhea and sweating. Treat fever with tylenol .  Decreas propranolol to 40 mg BID if BP not improving.    Pt higher risk for COVID complications given  HTN and CAD. Interaction with mysoline and paxolvid.. contraindicated.  Start molnupiravir 5 day course. Reviewed course of medication and side effect profile with patient in detail.   Symptomatic care with mucinex and cough suppressant at night. If SOB begins symptoms worsening.. have low threshold for in-person exam, if severe shortness of breath ER visit recommended.  Can monitor Oxygen saturation at home with home monitor if able to obtain.  Go to ER if O2 sat < 90% on room air.   Reviewed home care and provided information through [place].  Recommended quarantine 5 days isolation recommended. Return to work day 6 and wear mask for 4 more days to complete 10 days. Provided info about prevention of spread of COVID 19.

## 2022-10-14 NOTE — Progress Notes (Signed)
Patient ID: Julie Lewis, female    DOB: Oct 01, 1956, 66 y.o.   MRN: [id number]  This visit was conducted in person.  BP 90/60   Pulse (!) 55   Temp 98.6 F (37 C) (Oral)   Ht 5' 5.5" (1.664 m)   Wt 162 lb 4 oz (73.6 kg)   LMP 08/03/2020   SpO2 99%   BMI 26.59 kg/m    CC:  Chief Complaint  Patient presents with   Fatigue    Symptoms started on Saturday    Fever   Diarrhea   Cough    With clear phlegm    Subjective:   HPI: Julie Lewis is a 66 y.o. female  pt of Dr. Damita Dunnings with history of HTN presenting on 10/14/2022 for Fatigue (Symptoms started on Saturday/), Fever, Diarrhea, and Cough (With clear phlegm)  Date of Onset: 10/21  Initial symptoms included  cough, dry and fatigue. Fever 102.40F Symptoms progressed to  productive cough, clear mucus, scratchy voice and throat.  Cough not keeping her up at night. No ear pain, no face pain. She is feeling very weak She is thirsty.Marland Kitchen trying to drink  water... but has only had 1/2 cup OJ and coffee today.   No SOB, no wheeze.  Sick contacts: went to fair  She has tried to treat symptoms with: tylenol helped with fever and using nyquil    BP low on propranolol 80 mg BID ( already took today)  She reports she has been having lower BPs prior to this illness BP Readings from Last 3 Encounters:  10/14/22 90/60  06/20/22 122/72  01/10/22 127/78       Relevant past medical, surgical, family and social history reviewed and updated as indicated. Interim medical history since our last visit reviewed. Allergies and medications reviewed and updated. Outpatient Medications Prior to Visit  Medication Sig Dispense Refill   atorvastatin (LIPITOR) 40 MG tablet Take 40 mg by mouth daily.     B Complex Vitamins (VITAMIN B COMPLEX PO) Take by mouth.     citalopram (CELEXA) 10 MG tablet Take 10 mg by mouth daily.     Flaxseed Oil (LINSEED OIL) OIL      gabapentin (NEURONTIN) 300 MG capsule Take 1 capsule (300 mg total) by  mouth 2 (two) times daily. (Patient taking differently: Take 600 mg by mouth 2 (two) times daily.)     hydrocortisone 2.5 % cream Apply to right eyelid and lips twice a day for 2 weeks     meloxicam (MOBIC) 7.5 MG tablet Take 1 tablet (7.5 mg total) by mouth daily as needed for pain.     Multiple Vitamins-Minerals (MULTIVITAMIN GUMMIES ADULT PO) Take by mouth.     primidone (MYSOLINE) 250 MG tablet Take 1 tablet (250 mg total) by mouth in the morning and at bedtime.     propranolol (INDERAL) 40 MG tablet Take 120 mg in the AM and 80-120 mg in the PM (Patient taking differently: Take 80 mg by mouth 2 (two) times daily.)     Resveratrol 100 MG CAPS Take 1 tablet by mouth every morning.     tacrolimus (PROTOPIC) 0.1 % ointment Apply to right eyelid daily after finishing hydrocortisone     traMADol (ULTRAM) 50 MG tablet Take 1 tablet (50 mg total) by mouth every 12 (twelve) hours as needed.     valACYclovir (VALTREX) 500 MG tablet Take 1 tablet (500 mg total) by mouth 2 (two) times daily as needed.  No facility-administered medications prior to visit.     Per HPI unless specifically indicated in ROS section below Review of Systems  Constitutional:  Positive for fatigue and fever.  HENT:  Positive for congestion and sinus pressure. Negative for sinus pain, sneezing and sore throat.   Eyes:  Negative for pain.  Respiratory:  Positive for cough. Negative for shortness of breath.   Cardiovascular:  Negative for chest pain, palpitations and leg swelling.  Gastrointestinal:  Negative for abdominal pain.  Genitourinary:  Negative for dysuria and vaginal bleeding.  Musculoskeletal:  Negative for back pain.  Neurological:  Negative for syncope, light-headedness and headaches.  Psychiatric/Behavioral:  Negative for dysphoric mood.    Objective:  BP 90/60   Pulse (!) 55   Temp 98.6 F (37 C) (Oral)   Ht 5' 5.5" (1.664 m)   Wt 162 lb 4 oz (73.6 kg)   LMP 08/03/2020   SpO2 99%   BMI 26.59 kg/m    Wt Readings from Last 3 Encounters:  10/14/22 162 lb 4 oz (73.6 kg)  06/20/22 165 lb (74.8 kg)  01/10/22 166 lb (75.3 kg)      Physical Exam Constitutional:      General: She is not in acute distress.    Appearance: Normal appearance. She is well-developed. She is not ill-appearing or toxic-appearing.  HENT:     Head: Normocephalic.     Right Ear: Hearing, tympanic membrane, ear canal and external ear normal. Tympanic membrane is not erythematous, retracted or bulging.     Left Ear: Hearing, tympanic membrane, ear canal and external ear normal. Tympanic membrane is not erythematous, retracted or bulging.     Nose: No mucosal edema or rhinorrhea.     Right Sinus: No maxillary sinus tenderness or frontal sinus tenderness.     Left Sinus: No maxillary sinus tenderness or frontal sinus tenderness.     Mouth/Throat:     Pharynx: Uvula midline.  Eyes:     General: Lids are normal. Lids are everted, no foreign bodies appreciated.     Conjunctiva/sclera: Conjunctivae normal.     Pupils: Pupils are equal, round, and reactive to light.  Neck:     Thyroid: No thyroid mass or thyromegaly.     Vascular: No carotid bruit.     Trachea: Trachea normal.  Cardiovascular:     Rate and Rhythm: Normal rate and regular rhythm.     Pulses: Normal pulses.     Heart sounds: Normal heart sounds, S1 normal and S2 normal. No murmur heard.    No friction rub. No gallop.  Pulmonary:     Effort: Pulmonary effort is normal. No tachypnea or respiratory distress.     Breath sounds: Normal breath sounds. No decreased breath sounds, wheezing, rhonchi or rales.  Abdominal:     General: Bowel sounds are normal.     Palpations: Abdomen is soft.     Tenderness: There is no abdominal tenderness.  Musculoskeletal:     Cervical back: Normal range of motion and neck supple.  Skin:    General: Skin is warm and dry.     Findings: No rash.  Neurological:     Mental Status: She is alert.  Psychiatric:        Mood  and Affect: Mood is not anxious or depressed.        Speech: Speech normal.        Behavior: Behavior normal. Behavior is cooperative.        Thought Content:  Thought content normal.        Judgment: Judgment normal.       Results for orders placed or performed in visit on 01/10/22  Vitamin D (25 hydroxy)  Result Value Ref Range   Vit D, 25-Hydroxy 54.3 30.0 - 100.0 ng/mL  Calcium  Result Value Ref Range   Calcium 9.3 8.7 - 10.3 mg/dL  Creatinine  Result Value Ref Range   Creatinine, Ser 0.80 0.57 - 1.00 mg/dL   eGFR 82 >59 mL/min/1.73  TSH  Result Value Ref Range   TSH 2.050 0.450 - 4.500 uIU/mL  Vitamin B12  Result Value Ref Range   Vitamin B-12 1,048 232 - 1,245 pg/mL     COVID 19 screen:  No recent travel or known exposure to COVID19 The patient denies respiratory symptoms of COVID 19 at this time. The importance of social distancing was discussed today.   Assessment and Plan Problem List Items Addressed This Visit     COVID-19    COVID19  Infection < 5 days from onset of symptoms in  4 x vaccinated overweight individual with history of  HTN, CAD  No clear sign of bacterial infection at this time.   No SOB.  No red flags/need for ER visit or in-person exam at respiratory clinic at this time. I am concerned about her low BP... possible dehydration but also likely SE to propranolol.  I recommended pushing fluids to keep up with diarrhea and sweating. Treat fever with tylenol .  Decreas propranolol to 40 mg BID if BP not improving.    Pt higher risk for COVID complications given  HTN and CAD. Interaction with mysoline and paxolvid.. contraindicated.  Start molnupiravir 5 day course. Reviewed course of medication and side effect profile with patient in detail.   Symptomatic care with mucinex and cough suppressant at night. If SOB begins symptoms worsening.. have low threshold for in-person exam, if severe shortness of breath ER visit recommended.  Can monitor Oxygen  saturation at home with home monitor if able to obtain.  Go to ER if O2 sat < 90% on room air.   Reviewed home care and provided information through [facility].  Recommended quarantine 5 days isolation recommended. Return to work day 6 and wear mask for 4 more days to complete 10 days. Provided info about prevention of spread of COVID 19.       Relevant Medications   molnupiravir EUA (LAGEVRIO) 200 mg CAPS capsule   Other Visit Diagnoses     Acute cough    -  Primary   Relevant Orders   POC Influenza A&B (Binax test) (Completed)   POC COVID-19 (Completed)      Meds ordered this encounter  Medications   molnupiravir EUA (LAGEVRIO) 200 mg CAPS capsule    Sig: Take 4 capsules (800 mg total) by mouth 2 (two) times daily for 5 days.    Dispense:  40 capsule    Refill:  0       Eliezer Lofts, MD

## 2023-02-03 ENCOUNTER — Telehealth: Payer: Self-pay

## 2023-02-03 ENCOUNTER — Encounter: Payer: Self-pay | Admitting: Family

## 2023-02-03 ENCOUNTER — Ambulatory Visit (INDEPENDENT_AMBULATORY_CARE_PROVIDER_SITE_OTHER): Payer: Medicare PPO | Admitting: Family

## 2023-02-03 VITALS — BP 128/86 | HR 60 | Temp 97.8°F | Ht 65.5 in | Wt 168.4 lb

## 2023-02-03 DIAGNOSIS — R300 Dysuria: Secondary | ICD-10-CM | POA: Diagnosis not present

## 2023-02-03 DIAGNOSIS — N3000 Acute cystitis without hematuria: Secondary | ICD-10-CM | POA: Diagnosis not present

## 2023-02-03 LAB — POC URINALSYSI DIPSTICK (AUTOMATED)
Bilirubin, UA: NEGATIVE
Blood, UA: NEGATIVE
Glucose, UA: NEGATIVE
Ketones, UA: NEGATIVE
Leukocytes, UA: NEGATIVE
Nitrite, UA: NEGATIVE
Protein, UA: POSITIVE — AB
Spec Grav, UA: 1.025 (ref 1.010–1.025)
Urobilinogen, UA: 0.2 E.U./dL
pH, UA: 6 (ref 5.0–8.0)

## 2023-02-03 MED ORDER — NITROFURANTOIN MONOHYD MACRO 100 MG PO CAPS
100.0000 mg | ORAL_CAPSULE | Freq: Two times a day (BID) | ORAL | 0 refills | Status: DC
Start: 2023-02-03 — End: 2024-01-29

## 2023-02-03 NOTE — Telephone Encounter (Signed)
Per chart review tab pt has already been seen by t Dugal FNP. Sending note as FYI to Red Christians FNP.

## 2023-02-03 NOTE — Telephone Encounter (Signed)
[facility] - Client TELEPHONE ADVICE RECORD [facility] Patient Name: Julie Lewis [facility] Gender: Female DOB: 01-29-1956 Age: 67 Y 22 D Return Phone Number: [phone] (Primary) Address: City/ State/ Zip: [place] [address] Client [place] Night - Client Client Site [place] - Night Provider Renford Dills - MD Contact Type Call Who Is Calling Patient / Member / Family / Caregiver Call Type Triage / Clinical Relationship To Patient Self Return Phone Number [phone] (Primary) Chief Complaint Urination Frequency Reason for Call Symptomatic / Request for [place] has a bladder infection are uti. Caller sx burning sensation and frequent urinating with discomfort in lower abdomen. Translation No Guidelines Guideline Title Affirmed Question Affirmed Notes Nurse Date/Time Eilene Ghazi Time) Urination Pain - Female Age > 70 years Rubie Maid 02/02/2023 5:33:36 PM Shoulder Pain [1] Age > 40 AND [2] no obvious cause AND [3] pain even when not moving the arm (Exception: Pain is clearly made worse by moving arm or bending neck.) Patsey Berthold RN, Lattie Haw 02/02/2023 5:39:10 PM Disp. Time Eilene Ghazi Time) Disposition Final User 02/02/2023 5:37:57 PM See PCP within 24 Hours Rubie Maid 02/02/2023 5:42:39 PM Go to ED Now Yes Patsey Berthold, RN, Lattie Haw Final Disposition 02/02/2023 5:42:39 PM Go to ED Now Yes Patsey Berthold, RN, Leland Johns Disagree/Comply Comply Caller Understands Yes PreDisposition InappropriateToAsk PLEASE NOTE: All timestamps contained within this report are represented as [place] Standard Time. CONFIDENTIALTY NOTICE: This fax transmission is intended only for the addressee. It contains information that is legally privileged, confidential or otherwise protected from use or disclosure. If you are not the intended recipient, you are strictly prohibited from  reviewing, disclosing, copying using or disseminating any of this information or taking any action in reliance on or regarding this information. If you have received this fax in error, please notify us immediately by telephone so that we can arrange for its return to [place]. Phone: [phone], Toll-Free: [phone], Fax: [phone] Page: 2 of 2 Call Id: [id number] Care Advice Given Per Guideline SEE PCP WITHIN 24 HOURS: * IF OFFICE WILL BE OPEN: You need to be examined within the next 24 hours. Call your doctor (or NP/PA) when the office opens and make an appointment. DRINK EXTRA FLUIDS: * Drink extra fluids. * Drink 8 to 10 cups (1,800 to 2,400 ml) of liquids a day. CRANBERRY JUICE: * Some people think that drinking cranberry juice helps fight off urinary tract infections. * There is some research that shows cranberry juice might help prevent a urine infection. * Sit in a warm sitz bath for 20 minutes twice a day. This will decrease pain and irritation, keep the area clean, and help with healing. CALL BACK IF: * Fever or back pain occurs * You become worse CARE ADVICE given per Urination Pain - Female (Adult) guideline. GO TO ED NOW: * Leave now. Drive carefully. NOTE TO TRIAGER - DRIVING: * Another adult should drive. * Patient should not delay going to the emergency department. BRING MEDICINES: * Bring a list of your current medicines when you go to the Emergency Department (ER). * Bring the pill bottles too. This will help the doctor (or NP/PA) to make certain you are taking the right medicines and the right dose. CARE ADVICE given per Shoulder Pain (Adult) guideline Referrals REFERRED TO PCP OFFICE GO TO FACILITY UNDECIDE

## 2023-02-03 NOTE — Progress Notes (Signed)
Will treat as UTI as symptomatic.  Urine culture ordered pending results to confirm antbx

## 2023-02-03 NOTE — Progress Notes (Unsigned)
Established Patient Office Visit  Subjective:   Patient ID: Julie Lewis, female    DOB: 1956-04-05  Age: 67 y.o. MRN: [id number]  CC:  Chief Complaint  Patient presents with   Urinary Tract Infection    HPI: Julie Lewis is a 67 y.o. female presenting on 02/03/2023 for Urinary Tract Infection     Urinary Tract Infection     Few weeks ago started with dysuria . Also with increased urinary urgency and frequency. Not seeing blood in the urine. Denies flank pain , no fever or chills. No vaginal discharge.  Also with some slight pelvic pain.            ROS: Negative unless specifically indicated above in HPI.   Relevant past medical history reviewed and updated as indicated.   Allergies and medications reviewed and updated.   Current Outpatient Medications:    atorvastatin (LIPITOR) 40 MG tablet, Take 40 mg by mouth daily., Disp: , Rfl:    B Complex Vitamins (VITAMIN B COMPLEX PO), Take by mouth., Disp: , Rfl:    citalopram (CELEXA) 10 MG tablet, Take 10 mg by mouth daily., Disp: , Rfl:    Flaxseed Oil (LINSEED OIL) OIL, , Disp: , Rfl:    gabapentin (NEURONTIN) 300 MG capsule, Take 1 capsule (300 mg total) by mouth 2 (two) times daily. (Patient taking differently: Take 600 mg by mouth 2 (two) times daily.), Disp: , Rfl:    Multiple Vitamins-Minerals (MULTIVITAMIN GUMMIES ADULT PO), Take by mouth., Disp: , Rfl:    primidone (MYSOLINE) 250 MG tablet, Take 1 tablet (250 mg total) by mouth in the morning and at bedtime., Disp: , Rfl:    propranolol (INDERAL) 40 MG tablet, Take 120 mg in the AM and 80-120 mg in the PM (Patient taking differently: Take 80 mg by mouth 2 (two) times daily. 3- 30m in morning and 1- 468mat night), Disp: , Rfl:    Resveratrol 100 MG CAPS, Take 1 tablet by mouth every morning., Disp: , Rfl:    traMADol (ULTRAM) 50 MG tablet, Take 1 tablet (50 mg total) by mouth every 12 (twelve) hours as needed., Disp: , Rfl:    valACYclovir (VALTREX)  500 MG tablet, Take 1 tablet (500 mg total) by mouth 2 (two) times daily as needed., Disp: , Rfl:    meloxicam (MOBIC) 7.5 MG tablet, Take 1 tablet (7.5 mg total) by mouth daily as needed for pain., Disp: , Rfl:   Allergies  Allergen Reactions   Bupropion Other (See Comments)    myoclonus   Erythromycin     Severe stomach issues    Epinephrine Other (See Comments)    Chest pain. Intolerance to epinephrine   Iodinated Contrast Media Rash    Contrast dye   Penicillins Rash    Objective:   BP 128/86   Pulse 60   Temp 97.8 F (36.6 C) (Temporal)   Ht 5' 5.5" (1.664 m)   Wt 168 lb 6.4 oz (76.4 kg)   LMP 08/03/2020   SpO2 99%   BMI 27.60 kg/m    Physical Exam Constitutional:      General: She is not in acute distress.    Appearance: Normal appearance. She is normal weight. She is not ill-appearing, toxic-appearing or diaphoretic.  Cardiovascular:     Rate and Rhythm: Normal rate.  Pulmonary:     Effort: Pulmonary effort is normal.  Abdominal:     General: Abdomen is flat.  Tenderness: There is no abdominal tenderness. There is no right CVA tenderness or left CVA tenderness.  Neurological:     General: No focal deficit present.     Mental Status: She is alert and oriented to person, place, and time. Mental status is at baseline.     Motor: No weakness.  Psychiatric:        Mood and Affect: Mood normal.        Behavior: Behavior normal.        Thought Content: Thought content normal.        Judgment: Judgment normal.     Assessment & Plan:  There are no diagnoses linked to this encounter.   Follow up plan: No follow-ups on file.  Eugenia Pancoast, FNP

## 2023-02-04 DIAGNOSIS — N3000 Acute cystitis without hematuria: Secondary | ICD-10-CM | POA: Insufficient documentation

## 2023-02-04 LAB — URINE CULTURE
MICRO NUMBER:: 14558450
Result:: NO GROWTH
SPECIMEN QUALITY:: ADEQUATE

## 2023-02-04 NOTE — Telephone Encounter (Signed)
Noted. Thanks.

## 2023-02-04 NOTE — Assessment & Plan Note (Signed)
poct urine dip in office Urine culture ordered pending results antbx sent to pharmacy, pt to take as directed. Encouraged increased water intake throughout the day. Choosing to treat due to being symptomatic. If no improvement in the next 2 days pt advised to let me know.  rx nitrofurantoin 100 mg po bix x 7 days

## 2023-02-07 ENCOUNTER — Emergency Department (HOSPITAL_COMMUNITY)
Admission: EM | Admit: 2023-02-07 | Discharge: 2023-02-08 | Discharge status: Against Medical Advice | Payer: Medicare PPO | Attending: Emergency Medicine | Admitting: Emergency Medicine

## 2023-02-07 ENCOUNTER — Other Ambulatory Visit: Payer: Self-pay

## 2023-02-07 DIAGNOSIS — R5383 Other fatigue: Secondary | ICD-10-CM | POA: Insufficient documentation

## 2023-02-07 DIAGNOSIS — R531 Weakness: Secondary | ICD-10-CM | POA: Diagnosis present

## 2023-02-07 DIAGNOSIS — M542 Cervicalgia: Secondary | ICD-10-CM | POA: Diagnosis not present

## 2023-02-07 DIAGNOSIS — Z5321 Procedure and treatment not carried out due to patient leaving prior to being seen by health care provider: Secondary | ICD-10-CM | POA: Diagnosis not present

## 2023-02-07 DIAGNOSIS — I959 Hypotension, unspecified: Secondary | ICD-10-CM | POA: Insufficient documentation

## 2023-02-07 LAB — CBC WITH DIFFERENTIAL/PLATELET
Abs Immature Granulocytes: 0.01 10*3/uL (ref 0.00–0.07)
Basophils Absolute: 0 10*3/uL (ref 0.0–0.1)
Basophils Relative: 1 %
Eosinophils Absolute: 0.1 10*3/uL (ref 0.0–0.5)
Eosinophils Relative: 1 %
HCT: 36.6 % (ref 36.0–46.0)
Hemoglobin: 13.6 g/dL (ref 12.0–15.0)
Immature Granulocytes: 0 %
Lymphocytes Relative: 38 %
Lymphs Abs: 2.2 10*3/uL (ref 0.7–4.0)
MCH: 34.3 pg — ABNORMAL HIGH (ref 26.0–34.0)
MCHC: 37.2 g/dL — ABNORMAL HIGH (ref 30.0–36.0)
MCV: 92.2 fL (ref 80.0–100.0)
Monocytes Absolute: 0.5 10*3/uL (ref 0.1–1.0)
Monocytes Relative: 8 %
Neutro Abs: 3 10*3/uL (ref 1.7–7.7)
Neutrophils Relative %: 52 %
Platelets: 149 10*3/uL — ABNORMAL LOW (ref 150–400)
RBC: 3.97 MIL/uL (ref 3.87–5.11)
RDW: 12 % (ref 11.5–15.5)
WBC: 5.8 10*3/uL (ref 4.0–10.5)
nRBC: 0 % (ref 0.0–0.2)

## 2023-02-07 LAB — BASIC METABOLIC PANEL
Anion gap: 9 (ref 5–15)
BUN: 12 mg/dL (ref 8–23)
CO2: 25 mmol/L (ref 22–32)
Calcium: 9.1 mg/dL (ref 8.9–10.3)
Chloride: 102 mmol/L (ref 98–111)
Creatinine, Ser: 1.04 mg/dL — ABNORMAL HIGH (ref 0.44–1.00)
GFR, Estimated: 59 mL/min — ABNORMAL LOW (ref >60)
Glucose, Bld: 84 mg/dL (ref 70–99)
Potassium: 3.7 mmol/L (ref 3.5–5.1)
Sodium: 136 mmol/L (ref 135–145)

## 2023-02-07 NOTE — ED Notes (Addendum)
Patient states she is feeling better and would like to go home. States she will just check mychart for her results. Advised patient to stay and be seen but patient states she is leaving.

## 2023-02-07 NOTE — ED Triage Notes (Addendum)
Patient reports generalized weakness/fatigue and posterior neck pain with hypotension today , denies injury/alert and oriented . Currently taking oral antibiotic for UTI .

## 2023-02-08 ENCOUNTER — Telehealth: Payer: Self-pay | Admitting: Family Medicine

## 2023-02-08 NOTE — Telephone Encounter (Signed)
Please get update on patient.  Thanks. 

## 2023-02-09 ENCOUNTER — Telehealth: Payer: Self-pay

## 2023-02-09 NOTE — Transitions of Care (Post Inpatient/ED Visit) (Unsigned)
   02/09/2023  Name: Julie Lewis MRN: [id number] DOB: 09/25/1956  Today's TOC FU Call Status: Today's TOC FU Call Status:: Unsuccessul Call (1st Attempt) Unsuccessful Call (1st Attempt) Date: 02/09/23  Attempted to reach the patient regarding the most recent Inpatient/ED visit.  Follow Up Plan: Additional outreach attempts will be made to reach the patient to complete the Transitions of Care (Post Inpatient/ED visit) call.   Signature Francella Solian, CMA

## 2023-02-09 NOTE — Telephone Encounter (Signed)
LMTCB

## 2023-02-09 NOTE — Transitions of Care (Post Inpatient/ED Visit) (Unsigned)
   02/09/2023  Name: Julie Lewis MRN: [id number] DOB: December 01, 1956  Today's TOC FU Call Status: Today's TOC FU Call Status:: Unsuccessul Call (1st Attempt) Unsuccessful Call (1st Attempt) Date: 02/09/23  Attempted to reach the patient regarding the most recent Inpatient/ED visit.  Follow Up Plan: Additional outreach attempts will be made to reach the patient to complete the Transitions of Care (Post Inpatient/ED visit) call.   [place] LPN [place] Advisor Direct Dial [phone]

## 2023-02-10 NOTE — Transitions of Care (Post Inpatient/ED Visit) (Unsigned)
   02/10/2023  Name: Julie Lewis MRN: [id number] DOB: Oct 05, 1956  Today's TOC FU Call Status: Today's TOC FU Call Status:: Unsuccessful Call (2nd Attempt) Unsuccessful Call (1st Attempt) Date: 02/09/23 Unsuccessful Call (2nd Attempt) Date: 02/10/23  Attempted to reach the patient regarding the most recent Inpatient/ED visit.  Follow Up Plan: Additional outreach attempts will be made to reach the patient to complete the Transitions of Care (Post Inpatient/ED visit) call.   [place] LPN [place] Advisor Direct Dial [phone]

## 2023-02-10 NOTE — Telephone Encounter (Signed)
Patient has been called three times to do TCM call and has been unsuccessful each time.

## 2023-02-11 NOTE — Transitions of Care (Post Inpatient/ED Visit) (Signed)
   02/11/2023  Name: Julie Lewis MRN: [id number] DOB: Dec 16, 1956  Today's TOC FU Call Status: Today's TOC FU Call Status:: Unsuccessful Call (3rd Attempt) Unsuccessful Call (1st Attempt) Date: 02/09/23 Unsuccessful Call (2nd Attempt) Date: 02/10/23 Unsuccessful Call (3rd Attempt) Date: 02/11/23  Attempted to reach the patient regarding the most recent Inpatient/ED visit.  Follow Up Plan: No further outreach attempts will be made at this time. We have been unable to contact the patient.  [place] LPN [place] Advisor Direct Dial [phone]

## 2023-02-12 NOTE — Transitions of Care (Post Inpatient/ED Visit) (Signed)
   02/12/2023  Name: Julie Lewis MRN: [id number] DOB: May 23, 1956  Today's TOC FU Call Status: Today's TOC FU Call Status:: Unsuccessul Call (1st Attempt) Unsuccessful Call (1st Attempt) Date: 02/09/23  Attempted to reach the patient regarding the most recent Inpatient/ED visit.  Follow Up Plan: No further outreach attempts will be made at this time. We have been unable to contact the patient. Three attempt to reach patient were made by Juanda Crumble LPN in addition to my attempts. We were not able to reach patient for [facility] call.  Signature Francella Solian, CMA

## 2023-02-12 NOTE — Transitions of Care (Post Inpatient/ED Visit) (Deleted)
   02/12/2023  Name: Julie Lewis MRN: [id number] DOB: 01/04/1956  Today's TOC FU Call Status: Today's TOC FU Call Status:: Unsuccessul Call (1st Attempt) Unsuccessful Call (1st Attempt) Date: 02/09/23  Attempted to reach the patient regarding the most recent Inpatient/ED visit.  Follow Up Plan: No further outreach attempts will be made at this time. We have been unable to contact the patient.  Three attempt to reach patient were made by Juanda Crumble LPN in addition to my attempts. We were not able to reach patient for [facility] call.   Signature ***

## 2023-02-16 ENCOUNTER — Other Ambulatory Visit: Payer: Self-pay | Admitting: Obstetrics and Gynecology

## 2023-02-16 DIAGNOSIS — Z1231 Encounter for screening mammogram for malignant neoplasm of breast: Secondary | ICD-10-CM

## 2023-02-21 ENCOUNTER — Other Ambulatory Visit: Payer: Self-pay | Admitting: Family

## 2023-02-21 DIAGNOSIS — N3000 Acute cystitis without hematuria: Secondary | ICD-10-CM

## 2023-02-23 ENCOUNTER — Other Ambulatory Visit: Payer: Self-pay | Admitting: Family

## 2023-02-23 DIAGNOSIS — N3000 Acute cystitis without hematuria: Secondary | ICD-10-CM

## 2023-02-24 ENCOUNTER — Encounter: Payer: Self-pay | Admitting: Family

## 2023-02-24 ENCOUNTER — Ambulatory Visit (INDEPENDENT_AMBULATORY_CARE_PROVIDER_SITE_OTHER): Payer: Medicare PPO | Admitting: Family

## 2023-02-24 VITALS — BP 120/68 | HR 63 | Temp 94.5°F | Ht 65.5 in | Wt 166.0 lb

## 2023-02-24 DIAGNOSIS — R109 Unspecified abdominal pain: Secondary | ICD-10-CM

## 2023-02-24 DIAGNOSIS — R300 Dysuria: Secondary | ICD-10-CM | POA: Diagnosis not present

## 2023-02-24 DIAGNOSIS — R801 Persistent proteinuria, unspecified: Secondary | ICD-10-CM | POA: Diagnosis not present

## 2023-02-24 LAB — POC URINALSYSI DIPSTICK (AUTOMATED)
Bilirubin, UA: NEGATIVE
Blood, UA: NEGATIVE
Glucose, UA: NEGATIVE
Ketones, UA: NEGATIVE
Leukocytes, UA: NEGATIVE
Nitrite, UA: NEGATIVE
Protein, UA: POSITIVE — AB
Spec Grav, UA: 1.02 (ref 1.010–1.025)
Urobilinogen, UA: 0.2 E.U./dL
pH, UA: 6 (ref 5.0–8.0)

## 2023-02-24 LAB — URINALYSIS, ROUTINE W REFLEX MICROSCOPIC
Bilirubin Urine: NEGATIVE
Hgb urine dipstick: NEGATIVE
Ketones, ur: NEGATIVE
Leukocytes,Ua: NEGATIVE
Nitrite: NEGATIVE
Specific Gravity, Urine: 1.025 (ref 1.000–1.030)
Total Protein, Urine: NEGATIVE
Urine Glucose: NEGATIVE
Urobilinogen, UA: 1 (ref 0.0–1.0)
pH: 6 (ref 5.0–8.0)

## 2023-02-24 NOTE — Progress Notes (Signed)
Established Patient Office Visit  Subjective:   Patient ID: Julie Lewis, female    DOB: 21-Sep-1956  Age: 67 y.o. MRN: [id number]  CC:  Chief Complaint  Patient presents with   Dysuria    X 2 days with abdominal pain     HPI: Julie Lewis is a 67 y.o. female presenting on 02/24/2023 for Dysuria (X 2 days with abdominal pain )  HPI  2/13 was seen in the office and had c/o dysuria with increased urinary frequency and urgency. Was given macrobid 500 mg po qd for five days, states about one week after taking did have resolution of symptoms.  Urine culture came back negative for UTI   About four days ago did start again with lower abdominal suprapubic tenderness as well as dysuria. She states yesterday had stabbing bil back pain that was sudden and stabbing however did not reoccur and  today has not happened at all. No fever or chills.   Denies vaginal discharge.        ROS: Negative unless specifically indicated above in HPI.   Relevant past medical history reviewed and updated as indicated.   Allergies and medications reviewed and updated.   Current Outpatient Medications:    atorvastatin (LIPITOR) 40 MG tablet, Take 40 mg by mouth daily., Disp: , Rfl:    B Complex Vitamins (VITAMIN B COMPLEX PO), Take by mouth., Disp: , Rfl:    citalopram (CELEXA) 10 MG tablet, Take 10 mg by mouth daily., Disp: , Rfl:    Flaxseed Oil (LINSEED OIL) OIL, , Disp: , Rfl:    gabapentin (NEURONTIN) 300 MG capsule, Take 1 capsule (300 mg total) by mouth 2 (two) times daily. (Patient taking differently: Take 600 mg by mouth 2 (two) times daily.), Disp: , Rfl:    meloxicam (MOBIC) 7.5 MG tablet, Take 1 tablet (7.5 mg total) by mouth daily as needed for pain., Disp: , Rfl:    Multiple Vitamins-Minerals (MULTIVITAMIN GUMMIES ADULT PO), Take by mouth., Disp: , Rfl:    primidone (MYSOLINE) 250 MG tablet, Take 1 tablet (250 mg total) by mouth in the morning and at bedtime., Disp: , Rfl:     propranolol (INDERAL) 40 MG tablet, Take 120 mg in the AM and 80-120 mg in the PM (Patient taking differently: Take 80 mg by mouth 2 (two) times daily. 3- '40mg'$  in morning and 1- '40mg'$  at night), Disp: , Rfl:    Resveratrol 100 MG CAPS, Take 1 tablet by mouth every morning., Disp: , Rfl:    traMADol (ULTRAM) 50 MG tablet, Take 1 tablet (50 mg total) by mouth every 12 (twelve) hours as needed., Disp: , Rfl:    valACYclovir (VALTREX) 500 MG tablet, Take 1 tablet (500 mg total) by mouth 2 (two) times daily as needed., Disp: , Rfl:   Allergies  Allergen Reactions   Bupropion Other (See Comments)    myoclonus   Erythromycin     Severe stomach issues    Epinephrine Other (See Comments)    Chest pain. Intolerance to epinephrine   Iodinated Contrast Media Rash    Contrast dye   Penicillins Rash    Objective:   BP 120/68   Pulse 63   Temp (!) 94.5 F (34.7 C) (Temporal)   Ht 5' 5.5" (1.664 m)   Wt 166 lb (75.3 kg)   LMP 08/03/2020   SpO2 96%   BMI 27.20 kg/m    Physical Exam Constitutional:      General:  She is not in acute distress.    Appearance: Normal appearance. She is normal weight. She is not ill-appearing, toxic-appearing or diaphoretic.  Cardiovascular:     Rate and Rhythm: Normal rate.  Pulmonary:     Effort: Pulmonary effort is normal.  Abdominal:     General: Abdomen is flat.     Tenderness: There is no abdominal tenderness. There is no right CVA tenderness or left CVA tenderness.  Neurological:     General: No focal deficit present.     Mental Status: She is alert and oriented to person, place, and time. Mental status is at baseline.     Motor: No weakness.  Psychiatric:        Mood and Affect: Mood normal.        Behavior: Behavior normal.        Thought Content: Thought content normal.        Judgment: Judgment normal.     Assessment & Plan:  Dysuria Assessment & Plan: Urine dip in office today with protein, no signs of Uti but will send for UA and urine  culture for pt with symptoms.  Due to intermittent flank pain (although not today) and proteinuria will order u/s kidneys as these symptoms seem to persist.  Ddx vaginal atrophy  If ongoing symptoms consider pelvic exam/ urology referral  Pending results renal u/s   Did advise pt red flag symptoms if chills fever, worsening or more constant flank pain seek more urgent care.  Will hold off of medication now pending labs.   Orders: -     POCT Urinalysis Dipstick (Automated) -     US RENAL; Future  Persistent proteinuria -     Urine Culture -     Urinalysis, Routine w reflex microscopic -     US RENAL; Future  Bilateral flank pain -     US RENAL; Future     Follow up plan: Return in about 10 days (around 03/06/2023) for schedule f/u with Dr. Damita Dunnings .  Eugenia Pancoast, FNP

## 2023-02-24 NOTE — Patient Instructions (Addendum)
I have ordered imaging for you at [facility] outpatient diagnostic center. This order has been sent over for you electronically. This order is for a renal ultrasound. Please call [phone] to schedule this appointment.   Regards,   Eugenia Pancoast FNP-C

## 2023-02-24 NOTE — Assessment & Plan Note (Signed)
Urine dip in office today with protein, no signs of Uti but will send for UA and urine culture for pt with symptoms.  Due to intermittent flank pain (although not today) and proteinuria will order u/s kidneys as these symptoms seem to persist.  Ddx vaginal atrophy  If ongoing symptoms consider pelvic exam/ urology referral  Pending results renal u/s   Did advise pt red flag symptoms if chills fever, worsening or more constant flank pain seek more urgent care.  Will hold off of medication now pending labs.

## 2023-02-24 NOTE — Progress Notes (Signed)
Protein in urine, UTI less likely. UA ordered as well pending results. Order renal u/s pending results as well.

## 2023-02-25 ENCOUNTER — Ambulatory Visit
Admission: RE | Admit: 2023-02-25 | Discharge: 2023-02-25 | Disposition: A | Discharge status: Home / Self Care | Payer: Medicare PPO | Source: Ambulatory Visit | Attending: Family | Admitting: Family

## 2023-02-25 DIAGNOSIS — R300 Dysuria: Secondary | ICD-10-CM | POA: Insufficient documentation

## 2023-02-25 DIAGNOSIS — R801 Persistent proteinuria, unspecified: Secondary | ICD-10-CM | POA: Insufficient documentation

## 2023-02-25 DIAGNOSIS — R109 Unspecified abdominal pain: Secondary | ICD-10-CM | POA: Diagnosis present

## 2023-02-25 LAB — URINE CULTURE
MICRO NUMBER:: 14651033
Result:: NO GROWTH
SPECIMEN QUALITY:: ADEQUATE

## 2023-02-25 NOTE — Progress Notes (Signed)
Dr. Damita Dunnings, Juluis Rainier

## 2023-02-26 ENCOUNTER — Telehealth: Payer: Self-pay | Admitting: Family Medicine

## 2023-02-26 NOTE — Telephone Encounter (Signed)
Patient called in and would like for someone to give her a call in regards to her [place] result. Thank you!

## 2023-02-26 NOTE — Telephone Encounter (Signed)
Tried to call but number went straight to vm; lmtcb.

## 2023-03-02 NOTE — Telephone Encounter (Signed)
Tried to call but call went straight to VM and VM is full and could not leave message.

## 2023-03-18 ENCOUNTER — Ambulatory Visit
Admission: RE | Admit: 2023-03-18 | Discharge: 2023-03-18 | Disposition: A | Discharge status: Home / Self Care | Payer: Medicare PPO | Source: Ambulatory Visit | Attending: Obstetrics and Gynecology | Admitting: Obstetrics and Gynecology

## 2023-03-18 DIAGNOSIS — Z1231 Encounter for screening mammogram for malignant neoplasm of breast: Secondary | ICD-10-CM | POA: Insufficient documentation

## 2023-04-30 ENCOUNTER — Telehealth: Payer: Self-pay | Admitting: Family Medicine

## 2023-04-30 NOTE — Telephone Encounter (Signed)
[facility] Day - Client TELEPHONE ADVICE RECORD [facility] Patient Name First: Julie Poag Last: Lewis Gender: Female DOB: 02/05/1956 Age: 67 Y 3 M 18 D Return Phone Number: [phone] (Primary), [phone] (Secondary) Address: City/ State/ Zip: [place] [address] Client [facility] Day - Client Client Site [facility] - Day Provider Crawford Givens "Clelia Croft MD Contact Type Call Who Is Calling Patient / Member / Family / Caregiver Call Type Triage / Clinical Relationship To Patient Self Return Phone Number [phone] (Primary) Chief Complaint BLOOD PRESSURE LOW - Systolic (top number) 90 or less Reason for Call Symptomatic / Request for Health Information Initial Comment Caller states stated blood pressure has been ranging 70/50, weak, dizzy and passed out recently. Translation No   Nurse Assessment Nurse: Izora Ribas, RN, Melanie Date/Time (Eastern Time): 04/30/2023 3:46:32 PM Confirm and document reason for call. If symptomatic, describe symptoms. ---Caller states blood pressure has been ranging 70/50, weak, dizzy and passed out about a month ago. 102/?? after laying down for a while. Still in the bed now. Does the patient have any new or worsening symptoms? ---Yes Will a triage be completed? ---Yes Related visit to physician within the last 2 weeks? ---No Does the PT have any chronic conditions? (i.e. diabetes, asthma, this includes High risk factors for pregnancy, etc.) ---Yes List chronic conditions. ---Propranolol for tremors - cutting back per MD orders. Is this a behavioral health or substance abuse call? ---No    Guidelines Guideline Title Affirmed Question Affirmed Notes Nurse Date/Time (Eastern Time) Blood Pressure - Low [1] Systolic BP 90-110 AND [2] taking blood pressure medications AND [3] NOT dizzy, lightheaded or weak Izora Ribas, RN, Shawna Orleans 04/30/2023 3:48:01 PM PLEASE  NOTE: All timestamps contained within this report are represented as [place] Standard Time. CONFIDENTIALTY NOTICE: This fax transmission is intended only for the addressee. It contains information that is legally privileged, confidential or otherwise protected from use or disclosure. If you are not the intended recipient, you are strictly prohibited from reviewing, disclosing, copying using or disseminating any of this information or taking any action in reliance on or regarding this information. If you have received this fax in error, please notify us immediately by telephone so that we can arrange for its return to [place]. Phone: [phone], Toll-Free: [phone], Fax: [phone] Page: 2 of 2 Call Id: [id number] Disp. Time Lamount Cohen Time) Disposition Final User 04/30/2023 3:45:40 PM Send to Urgent Queue Mardee Postin 04/30/2023 3:52:41 PM See PCP within 24 Hours Yes Izora Ribas, RN, [place] Final Disposition 04/30/2023 3:52:41 PM See PCP within 24 Hours Yes Izora Ribas, RN, Shawna Orleans Caller Disagree/Comply Disagree Caller Understands Yes PreDisposition Call Doctor Care Advice Given Per Guideline SEE PCP WITHIN 24 HOURS: * IF OFFICE WILL BE OPEN: You need to be examined within the next 24 hours. Call your doctor (or NP/PA) when the office opens and make an appointment. CALL BACK IF: * Lightheadedness, weakness, or dizziness occurs * Systolic BP under 90 * You feel sick * You become worse CARE ADVICE given per Low Blood Pressure (Adult) guideline. Comments User: Patria Mane, RN Date/Time Lamount Cohen Time): 04/30/2023 3:51:25 PM Had her sit up to see how she feels. 124/90 User: Patria Mane, RN Date/Time (Eastern Time): 04/30/2023 3:53:26 PM Was laying down for about an hour and feels fine. User: Patria Mane, RN Date/Time Lamount Cohen Time): 04/30/2023 3:53:55 PM Schedule for 05/07/23 @ 2:30pm. User: Patria Mane, RN Date/Time Lamount Cohen Time): 04/30/2023 3:55:04 PM Has been a daily occurrence. Called  today since  husband was getting concerned. Referrals GO TO FACILITY UNDECIDE

## 2023-04-30 NOTE — Telephone Encounter (Signed)
Spoke with patient and she is taking 80 mg in the morning and 40 mg at night. I told her what Dr. Para March recommended and she stated that she will not adjust any of her medications until they are all discussed with her. Her tremors are bad and she stated that she can not cut her propranolol down more without having an in depth conversation about her meds with Dr. Para March.

## 2023-04-30 NOTE — Telephone Encounter (Signed)
Please verify her current dose of propranolol.  I would cut the dose by ~50%.  Please let me know about how much she is taking.  Thanks.

## 2023-04-30 NOTE — Telephone Encounter (Signed)
FYI: This call has been transferred to Access Nurse. Once the result note has been entered staff can address the message at that time.  Patient called in with the following symptoms:  Red Word: Patient called in and stated that her blood pressure has been reading 70/50 something. She stated that she has been feeling weak and dizzy, and passed out once. Only wants to see Dr. Para March.    Please advise at Mobile [phone] (mobile)  Message is routed to Provider Pool and [facility] Triage

## 2023-04-30 NOTE — Telephone Encounter (Signed)
Noted. Thanks.

## 2023-04-30 NOTE — Telephone Encounter (Signed)
I spoke with pt; pt already has appt with Dr Para March on 05/07/23 @ 2:30. Pt said has not passed out in over one month and BP is consistently low. Pt has dizziness on and off every day. Offered pt sooner appt on 05/01/23 but pt has previous obligation to meet with accountant. Pt did change appt to 05/04/23 at 3 PM with Dr Para March and [facility] & ED precautions given and pt voiced understanding. Sending note to Dr Para March and Para March pool and teams [place].

## 2023-05-04 ENCOUNTER — Encounter: Payer: Self-pay | Admitting: Family Medicine

## 2023-05-04 ENCOUNTER — Ambulatory Visit (INDEPENDENT_AMBULATORY_CARE_PROVIDER_SITE_OTHER): Payer: Medicare PPO | Admitting: Family Medicine

## 2023-05-04 VITALS — BP 100/80 | HR 56 | Temp 99.4°F | Ht 65.5 in | Wt 159.0 lb

## 2023-05-04 DIAGNOSIS — Z832 Family history of diseases of the blood and blood-forming organs and certain disorders involving the immune mechanism: Secondary | ICD-10-CM

## 2023-05-04 DIAGNOSIS — G250 Essential tremor: Secondary | ICD-10-CM

## 2023-05-04 DIAGNOSIS — R55 Syncope and collapse: Secondary | ICD-10-CM | POA: Diagnosis not present

## 2023-05-04 MED ORDER — GABAPENTIN 300 MG PO CAPS: 300.0000 mg | ORAL_CAPSULE | Freq: Two times a day (BID) | ORAL | Status: DC

## 2023-05-04 MED ORDER — PROPRANOLOL HCL 40 MG PO TABS: 40.0000 mg | ORAL_TABLET | Freq: Two times a day (BID) | ORAL | Status: DC

## 2023-05-04 NOTE — Patient Instructions (Signed)
I would stop topamax and split the propranolol to 40mg  twice a day.  Go to the lab on the way out.   If you have mychart we'll likely use that to update you.    Drink enough water to keep your urine clear or light colored.   Update [place] about how you feel in a few days.  Take care.  Glad to see you.

## 2023-05-04 NOTE — Progress Notes (Unsigned)
Multiple episodes of syncope over the last few months.  Lower BP noted.  She has prodrome of sx prior to syncope.  On propranolol for tremor at baseline.    She cut back to 80mg  in the AM, down from prior dose.  Was prev on 120 mg AM and 80-120 mg in the PM.   On topamax per neuro.  She has noted confusion/cloudy thoughts in the meantime since topamax start.    FH pernicious anemia.    No CP.  Not SOB.  No inc in BLE edema.    Meds, vitals, and allergies reviewed.   ROS: Per HPI unless specifically indicated in ROS section

## 2023-05-05 LAB — COMPREHENSIVE METABOLIC PANEL
ALT: 24 U/L (ref 0–35)
AST: 23 U/L (ref 0–37)
Albumin: 4.3 g/dL (ref 3.5–5.2)
Alkaline Phosphatase: 60 U/L (ref 39–117)
BUN: 13 mg/dL (ref 6–23)
CO2: 28 mEq/L (ref 19–32)
Calcium: 9.3 mg/dL (ref 8.4–10.5)
Chloride: 106 mEq/L (ref 96–112)
Creatinine, Ser: 0.83 mg/dL (ref 0.40–1.20)
GFR: 73 mL/min (ref >60.00)
Glucose, Bld: 84 mg/dL (ref 70–99)
Potassium: 4.7 mEq/L (ref 3.5–5.1)
Sodium: 140 mEq/L (ref 135–145)
Total Bilirubin: 0.5 mg/dL (ref 0.2–1.2)
Total Protein: 6.7 g/dL (ref 6.0–8.3)

## 2023-05-05 LAB — CBC WITH DIFFERENTIAL/PLATELET
Basophils Absolute: 0.1 10*3/uL (ref 0.0–0.1)
Basophils Relative: 1 % (ref 0.0–3.0)
Eosinophils Absolute: 0.1 10*3/uL (ref 0.0–0.7)
Eosinophils Relative: 1.6 % (ref 0.0–5.0)
HCT: 38.6 % (ref 36.0–46.0)
Hemoglobin: 13.8 g/dL (ref 12.0–15.0)
Lymphocytes Relative: 26.9 % (ref 12.0–46.0)
Lymphs Abs: 1.6 10*3/uL (ref 0.7–4.0)
MCHC: 35.9 g/dL (ref 30.0–36.0)
MCV: 95.1 fl (ref 78.0–100.0)
Monocytes Absolute: 0.6 10*3/uL (ref 0.1–1.0)
Monocytes Relative: 9.4 % (ref 3.0–12.0)
Neutro Abs: 3.7 10*3/uL (ref 1.4–7.7)
Neutrophils Relative %: 61.1 % (ref 43.0–77.0)
Platelets: 149 10*3/uL — ABNORMAL LOW (ref 150.0–400.0)
RBC: 4.06 Mil/uL (ref 3.87–5.11)
RDW: 12.9 % (ref 11.5–15.5)
WBC: 6 10*3/uL (ref 4.0–10.5)

## 2023-05-05 LAB — VITAMIN B12: Vitamin B-12: >1500 pg/mL — ABNORMAL HIGH (ref 211–911)

## 2023-05-05 LAB — TSH: TSH: 1.26 u[IU]/mL (ref 0.35–5.50)

## 2023-05-06 ENCOUNTER — Encounter: Payer: Self-pay | Admitting: Family Medicine

## 2023-05-06 ENCOUNTER — Ambulatory Visit (INDEPENDENT_AMBULATORY_CARE_PROVIDER_SITE_OTHER): Payer: Medicare PPO | Admitting: Family Medicine

## 2023-05-06 ENCOUNTER — Telehealth: Payer: Self-pay | Admitting: Family Medicine

## 2023-05-06 VITALS — BP 120/68 | HR 50 | Temp 97.8°F | Ht 65.5 in | Wt 160.0 lb

## 2023-05-06 DIAGNOSIS — I952 Hypotension due to drugs: Secondary | ICD-10-CM | POA: Diagnosis not present

## 2023-05-06 DIAGNOSIS — G250 Essential tremor: Secondary | ICD-10-CM

## 2023-05-06 DIAGNOSIS — Z832 Family history of diseases of the blood and blood-forming organs and certain disorders involving the immune mechanism: Secondary | ICD-10-CM | POA: Insufficient documentation

## 2023-05-06 DIAGNOSIS — I959 Hypotension, unspecified: Secondary | ICD-10-CM | POA: Insufficient documentation

## 2023-05-06 NOTE — Assessment & Plan Note (Signed)
Discussed.  Reasonable check B12 level.  See notes on labs.

## 2023-05-06 NOTE — Assessment & Plan Note (Signed)
Presumed to be due to combination of bradycardia and relative hypotension, both likely exacerbated by propranolol use.  Discussed cutting propranolol back to 40 mg in 2 separate doses and drinking plenty of fluids.  EKG without acute changes.  See notes on labs.

## 2023-05-06 NOTE — Assessment & Plan Note (Signed)
She has a history of tremor and I would expect that the tremor to be worse or more noticeable when she is on the lower dose of propranolol but it is likely worth decreasing her propranolol in the meantime.  She noted that she had confusion/cloudy thoughts since she had started Topamax and I think it makes sense to hold that in the meantime.  See notes on labs.  At this point still okay for outpatient follow-up.

## 2023-05-06 NOTE — Patient Instructions (Addendum)
Go down on propanolol to 20 mg twice daily (1/2 of the 40 mg pill)  Glad you are feeling better now  Keep up the fluids - aim for 60 oz daily  Ok to liberalize salt   Call and update [place] in a few days  Call earlier if you are worse or get more dizzy   Take care of yourself  Keep [place] posted

## 2023-05-06 NOTE — Assessment & Plan Note (Signed)
Pt has some soft orthostatic bp change with position  Baseline pulse in 50s  Reviewed pt's history (she denies h/o MI and h/o HTN both in the chart) Reviewed recent note from Dr Para March Reviewed recent EKG from Dr Para March  Reviewed recent labs-reassuring  Reviewed most recent neuro note from Np Aurelio Jew and Fidela Juneau MD from [facility]  I suspect until we cut the beta blocker or hold it we will not know what is going on  Agreed to cut propranolol to 20 mg bid (pt is hesitant to hold entirely due to severe tremor) Agreed may have to hold it in the future   If still hypotensive/bradycardic in the future would need cardiology eval  Today exam is reassuring/ bp was better here than at home She will call to update Will alert pcp and see when he wants her to f/u

## 2023-05-06 NOTE — Telephone Encounter (Signed)
Noted. Thanks.

## 2023-05-06 NOTE — Telephone Encounter (Signed)
FYI: This call has been transferred to Access Nurse. Once the result note has been entered staff can address the message at that time.  Patient called in with the following symptoms:  Red Word: blood pressure Patient called in and stated that her blood pressure was 71/49 with some dizziness and while speak with her she stated that her blood pressure was 122/79 with some dizziness.   Please advise at Mobile [phone] (mobile)  Message is routed to Provider Pool and [facility] Triage

## 2023-05-06 NOTE — Progress Notes (Signed)
Subjective:    Patient ID: Julie Lewis, female    DOB: [birth year], 67 y.o.   MRN: [id number]  HPI 67 yo pt of Dr Para March presents with low bp and dizziness Has h/o familial tremor  (head and hands)  H/o HTN and past MI   Wt Readings from Last 3 Encounters:  05/06/23 160 lb (72.6 kg)  05/04/23 159 lb (72.1 kg)  02/24/23 166 lb (75.3 kg)   26.22 kg/m  Vitals:   05/06/23 1556  BP: 120/68  Pulse: (!) 50  Temp: 97.8 F (36.6 C)  SpO2: 98%   Called with continued low bp  Standing and had to lie down quickly on the bed this past time      She has h/o syncope  Was seen for this by pcp on 5/13  Taking propranolol for tremor   EKG showed sinus bradycardia rate of 51   She was instructed to stop the topamax and split the propranolol to twice daily   Takes primidone  Used to take gabapentin   Neurologist is Jeanice Lim NP and Dr Fidela Juneau   Per pt - does not have heart disease     H/o bradycardia and relative hypotension   Called today noting that bp was down to 71/49 with dizziness  It came up to 122/79 but was still dizzy  Was instructed to come in today   Recent labs  Results for orders placed or performed in visit on 05/04/23  Comprehensive metabolic panel  Result Value Ref Range   Sodium 140 135 - 145 mEq/L   Potassium 4.7 3.5 - 5.1 mEq/L   Chloride 106 96 - 112 mEq/L   CO2 28 19 - 32 mEq/L   Glucose, Bld 84 70 - 99 mg/dL   BUN 13 6 - 23 mg/dL   Creatinine, Ser 9.56 0.40 - 1.20 mg/dL   Total Bilirubin 0.5 0.2 - 1.2 mg/dL   Alkaline Phosphatase 60 39 - 117 U/L   AST 23 0 - 37 U/L   ALT 24 0 - 35 U/L   Total Protein 6.7 6.0 - 8.3 g/dL   Albumin 4.3 3.5 - 5.2 g/dL   GFR 21.30 >86.57 mL/min   Calcium 9.3 8.4 - 10.5 mg/dL  CBC with Differential/Platelet  Result Value Ref Range   WBC 6.0 4.0 - 10.5 K/uL   RBC 4.06 3.87 - 5.11 Mil/uL   Hemoglobin 13.8 12.0 - 15.0 g/dL   HCT 84.6 96.2 - 95.2 %   MCV 95.1 78.0 - 100.0 fl   MCHC 35.9 30.0 - 36.0  g/dL   RDW 84.1 32.4 - 40.1 %   Platelets 149.0 (L) 150.0 - 400.0 K/uL   Neutrophils Relative % 61.1 43.0 - 77.0 %   Lymphocytes Relative 26.9 12.0 - 46.0 %   Monocytes Relative 9.4 3.0 - 12.0 %   Eosinophils Relative 1.6 0.0 - 5.0 %   Basophils Relative 1.0 0.0 - 3.0 %   Neutro Abs 3.7 1.4 - 7.7 K/uL   Lymphs Abs 1.6 0.7 - 4.0 K/uL   Monocytes Absolute 0.6 0.1 - 1.0 K/uL   Eosinophils Absolute 0.1 0.0 - 0.7 K/uL   Basophils Absolute 0.1 0.0 - 0.1 K/uL  TSH  Result Value Ref Range   TSH 1.26 0.35 - 5.50 uIU/mL  Vitamin B12  Result Value Ref Range   Vitamin B-12 >1500 (H) 211 - 911 pg/mL   Patient Active Problem List   Diagnosis Date Noted   Family history  of pernicious anemia 05/06/2023   Hypotension 05/06/2023   Persistent proteinuria 02/24/2023   Dysuria 02/24/2023   Acute cystitis without hematuria 02/04/2023   Syncope 06/20/2022   Elevated troponin 01/28/2021   HTN (hypertension) 01/02/2021   Type 2 MI (myocardial infarction) (HCC) 01/02/2021   Melanoma (HCC) 10/04/2020   Chronic pain of left lower extremity 06/09/2016   Chronic pain of left ankle 09/27/2014   Depression 10/26/2013   Familial tremor 10/26/2013   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   Past Surgical History:  Procedure Laterality Date   COLONOSCOPY     COLONOSCOPY WITH PROPOFOL N/A 12/11/2020   Procedure: COLONOSCOPY WITH PROPOFOL;  Surgeon: Pasty Spillers, MD;  Location: [facility] ENDOSCOPY;  Service: Endoscopy;  Laterality: N/A;   GALLBLADDER SURGERY     hemmorriod sugery     LEG SURGERY     MELANOMA EXCISION     OVARIAN CYST REMOVAL     Pyogenic granuloma excision     Social History   Tobacco Use   Smoking status: Never    Passive exposure: Past (as a child)    Smokeless tobacco: Never  Vaping Use   Vaping Use: Never used  Substance Use Topics   Alcohol use: Yes    Comment: occas   Drug use: No   Family History  Problem Relation Age of Onset   Heart failure Mother    Diabetes Mother    Hypertension Father    Heart failure Father    Tremor Sister        Multiple family members with tremor.   Heart attack Sister    Hypertension Brother    Breast cancer Cousin    Colon cancer Neg Hx    Allergies  Allergen Reactions   Bupropion Other (See Comments)    myoclonus   Erythromycin     Severe stomach issues    Epinephrine Other (See Comments)    Chest pain. Intolerance to epinephrine   Iodinated Contrast Media Rash    Contrast dye   Penicillins Rash   Current Outpatient Medications on File Prior to Visit  Medication Sig Dispense Refill   atorvastatin (LIPITOR) 40 MG tablet Take 40 mg by mouth daily.     B Complex Vitamins (VITAMIN B COMPLEX PO) Take by mouth.     citalopram (CELEXA) 10 MG tablet Take 10 mg by mouth daily.     Flaxseed Oil (LINSEED OIL) OIL      gabapentin (NEURONTIN) 300 MG capsule Take 1 capsule (300 mg total) by mouth 2 (two) times daily.     meloxicam (MOBIC) 7.5 MG tablet Take 1 tablet (7.5 mg total) by mouth daily as needed for pain.     Multiple Vitamins-Minerals (MULTIVITAMIN GUMMIES ADULT PO) Take by mouth.     primidone (MYSOLINE) 250 MG tablet Take 1 tablet (250 mg total) by mouth in the morning and at bedtime.     propranolol (INDERAL) 40 MG tablet Take 1 tablet (40 mg total) by mouth 2 (two) times daily. (Patient taking differently: Take 20 mg by mouth 2 (two) times daily.)     Resveratrol 100 MG CAPS Take 1 tablet by mouth every morning.  traMADol (ULTRAM) 50 MG tablet Take 1 tablet (50 mg total) by mouth every 12 (twelve) hours as needed.     valACYclovir (VALTREX) 500 MG tablet Take 1 tablet (500 mg total) by mouth 2 (two) times daily as needed.     No current facility-administered medications on  file prior to visit.    Review of Systems  Constitutional:  Positive for fatigue. Negative for activity change, appetite change, fever and unexpected weight change.  HENT:  Negative for congestion, ear pain, rhinorrhea, sinus pressure and sore throat.   Eyes:  Negative for pain, redness and visual disturbance.  Respiratory:  Negative for cough, shortness of breath and wheezing.   Cardiovascular:  Negative for chest pain and palpitations.  Gastrointestinal:  Negative for abdominal pain, blood in stool, constipation and diarrhea.  Endocrine: Negative for polydipsia and polyuria.  Genitourinary:  Negative for dysuria, frequency and urgency.  Musculoskeletal:  Negative for arthralgias, back pain and myalgias.  Skin:  Negative for pallor and rash.  Allergic/Immunologic: Negative for environmental allergies.  Neurological:  Positive for tremors and light-headedness. Negative for dizziness, seizures, syncope, facial asymmetry, weakness, numbness and headaches.  Hematological:  Negative for adenopathy. Does not bruise/bleed easily.  Psychiatric/Behavioral:  Negative for decreased concentration and dysphoric mood. The patient is not nervous/anxious.        Objective:   Physical Exam Constitutional:      General: She is not in acute distress.    Appearance: She is well-developed.  HENT:     Head: Normocephalic and atraumatic.  Eyes:     Conjunctiva/sclera: Conjunctivae normal.     Pupils: Pupils are equal, round, and reactive to light.  Neck:     Thyroid: No thyromegaly.     Vascular: No carotid bruit or JVD.  Cardiovascular:     Rate and Rhythm: Regular rhythm. Bradycardia present.     Heart sounds: Normal heart sounds.     No gallop.  Pulmonary:     Effort: Pulmonary effort is normal. No respiratory distress.     Breath sounds: Normal breath sounds. No wheezing or rales.  Abdominal:     General: There is no distension or abdominal bruit.     Palpations: Abdomen is soft.      Tenderness: There is no abdominal tenderness.  Musculoskeletal:     Cervical back: Normal range of motion and neck supple.     Right lower leg: No edema.     Left lower leg: No edema.  Lymphadenopathy:     Cervical: No cervical adenopathy.  Skin:    General: Skin is warm and dry.     Coloration: Skin is not pale.     Findings: No rash.  Neurological:     Mental Status: She is alert.     Coordination: Coordination normal.     Deep Tendon Reflexes: Reflexes are normal and symmetric. Reflexes normal.     Comments: Mild hand tremor during intension   Psychiatric:        Mood and Affect: Mood normal.           Assessment & Plan:   Problem List Items Addressed This Visit       Cardiovascular and Mediastinum   Hypotension - Primary    Pt has some soft orthostatic bp change with position  Baseline pulse in 50s  Reviewed pt's history (she denies h/o MI and h/o HTN both in the chart) Reviewed recent note from Dr Para March Reviewed recent EKG from Dr Para March  Reviewed recent labs-reassuring  Reviewed most recent neuro note from Np Stuble and Fidela Juneau MD from [facility]  I suspect until we cut the beta blocker or hold it we will not know what is going on  Agreed to cut propranolol to 20 mg bid (pt is hesitant to hold entirely due to severe tremor) Agreed may have to hold it in the future   If still hypotensive/bradycardic in the future would need cardiology eval  Today exam is reassuring/ bp was better here than at home She will call to update Will alert pcp and see when he wants her to f/u        Nervous and Auditory   Familial tremor    Controlled by primidone and propranolol Last neuro notes reviewed Off topamax/ did not tolerate Off gabapentin   Beta blocker may worsen hypotension and bradycardia  See a/p for hypotension

## 2023-05-06 NOTE — Telephone Encounter (Signed)
Pt sent to Access Nurse. Pt scheduled for an appointment with Dr. Milinda Antis at [facility] today.  Pt reports that she has decreased propranolol as requested in appointment with Dr. Para March on 5/13, she already drinks a lot of water so is unable to increase what she is already taking in.  BP 71/49 was when sitting and 122/79 was when laying down.  Pt reports dizziness.

## 2023-05-06 NOTE — Assessment & Plan Note (Signed)
Controlled by primidone and propranolol Last neuro notes reviewed Off topamax/ did not tolerate Off gabapentin   Beta blocker may worsen hypotension and bradycardia  See a/p for hypotension

## 2023-05-07 ENCOUNTER — Ambulatory Visit: Payer: Medicare PPO | Admitting: Family Medicine

## 2023-05-10 ENCOUNTER — Telehealth: Payer: Self-pay | Admitting: Family Medicine

## 2023-05-10 NOTE — Telephone Encounter (Signed)
Please see about patient getting in for follow-up in about 1 week regarding blood pressure and pulse and tremor.  Thanks.

## 2023-05-11 NOTE — Telephone Encounter (Signed)
Spoke to pt, scheduled ov for 05/19/23

## 2023-05-19 ENCOUNTER — Ambulatory Visit (INDEPENDENT_AMBULATORY_CARE_PROVIDER_SITE_OTHER): Payer: Medicare PPO | Admitting: Family Medicine

## 2023-05-19 ENCOUNTER — Encounter: Payer: Self-pay | Admitting: Family Medicine

## 2023-05-19 VITALS — BP 90/62 | HR 76 | Temp 98.0°F | Ht 65.5 in | Wt 160.0 lb

## 2023-05-19 DIAGNOSIS — R9431 Abnormal electrocardiogram [ECG] [EKG]: Secondary | ICD-10-CM

## 2023-05-19 DIAGNOSIS — I959 Hypotension, unspecified: Secondary | ICD-10-CM

## 2023-05-19 NOTE — Progress Notes (Unsigned)
Still taking primidone 250mg  BID.  Off propranolol.  Tremor with hands in extension much less at rest.  She feels diffusely weak.  Not focally weak.  Prev was on propranolol for decades.    Prev stress testing with   1. LVEF 60%, grade 1 DD, no significant valve disease  2. This is a negative stress echocardiogram for ischemia.  3. This is a low risk study.   Not SOB. No syncope since last OV.  She can get lightheaded, better sitting down.  She has had variable energy level, with a few days with some more energy recently.  She is taking extra salt and extra water.     No bruit.   Ctab Rrr No murmur No BLE edema.

## 2023-05-19 NOTE — Patient Instructions (Signed)
Drink enough fluid to keep your urine clear and keep adding salt to your food.  You should get a call about scheduling the echo and the cardiology appointment.  Don't change your meds for now.  Take care.  Glad to see you.

## 2023-05-20 NOTE — Assessment & Plan Note (Signed)
The concern is that she has persistent hypotension in spite of stopping propranolol.  Her heart rate has improved some in the meantime off beta-blockade.  Discussed options. I asked her to drink enough fluid to keep her urine clear and keep adding salt to her food.  Refer for cardiology evaluation and echo.  Rationale for checking echo discussed with patient.  No change in medications otherwise for now.  She is putting up with tremor in the meantime.  We talked about seeing neurology about her tremor in the future but I think it makes sense to address her relatively low blood pressure first.  At this point still okay for outpatient follow-up.  30 minutes were devoted to patient care in this encounter (this includes time spent reviewing the patient's file/history, interviewing and examining the patient, counseling/reviewing plan with patient).

## 2023-05-26 ENCOUNTER — Ambulatory Visit (HOSPITAL_COMMUNITY): Payer: Medicare PPO | Attending: Internal Medicine

## 2023-05-26 ENCOUNTER — Encounter: Payer: Self-pay | Admitting: Family Medicine

## 2023-05-26 DIAGNOSIS — R9431 Abnormal electrocardiogram [ECG] [EKG]: Secondary | ICD-10-CM

## 2023-05-26 LAB — ECHOCARDIOGRAM COMPLETE
Area-P 1/2: 3.19 cm2
S' Lateral: 2.1 cm

## 2023-05-27 ENCOUNTER — Telehealth: Payer: Self-pay | Admitting: Family Medicine

## 2023-05-27 NOTE — Telephone Encounter (Signed)
If she is still symptomatic then I recommend ER-thank you for giving her ER cautions.  Please get update on patient Thursday.  Thanks.

## 2023-05-27 NOTE — Telephone Encounter (Signed)
Please call patient about syncope.  Please triage her about most recent event.  See if she is able to check her blood pressure at home.  See the attached result note on her echo.  There were no emergent issues identified on her echo.  I am checking to see when she can have her cardiology evaluation.

## 2023-05-27 NOTE — Telephone Encounter (Signed)
Left v/m requesting pt to cb [phone] and if after 5 pm can still cb and speak with access nurse.

## 2023-05-27 NOTE — Telephone Encounter (Signed)
Left v/m requesting cb from pt at [phone].

## 2023-05-27 NOTE — Telephone Encounter (Signed)
When can this patient be evaluated by cardiology?  It needs to be a stat referral.  Thanks.

## 2023-05-27 NOTE — Telephone Encounter (Signed)
Pt notified as instructed and pt voiced understanding; pt said she is going to eat and then she will probably go to ED. Pt will call on 05/28/23 with update also. Sending note to Dr Para March.

## 2023-05-27 NOTE — Telephone Encounter (Signed)
I spoke with pt; pt said last time passed out was on 05/25/23 and pt was on back deck  an;d felt dizzy and then woke up laying on deck. Pt is not sure if mhit her head and does not know how long she was out. Thiis morning pt was dizzy;blurred vision and pt felt like she was going to drop but pt sat on floor and did not lose consciousness.pt said she is laying down now and does not have symptoms. No H/A,dizziness,CP ,SOB or blurred vision., pt took BP at 2pm was 93/58 and now BP 154/98 P 70. Pt notified of Dr Lianne Bushy note ans pt voiced understanding. Pt has been drinking water and urine is clear; pt adding salt to her food. Pt is off propranolol. Pt does not feel good but does not want to go to ED. Sending note to Dr Para March and ED precautions given again and pt voiced understanding. Pt will call with update on 05/27/53.

## 2023-05-28 NOTE — Telephone Encounter (Signed)
Julie Lewis  You1 hour ago (11:58 AM)   EL Office reached out to get pt schedule on 5/30 they had to LM .  I called office and got pt scheduled for 05/29/23 at 330, I had to leave a VM with appt info. Because pt didn't answer.  Erica   ========== Noted. Thanks.

## 2023-05-29 ENCOUNTER — Ambulatory Visit: Payer: Medicare PPO | Attending: Internal Medicine | Admitting: Internal Medicine

## 2023-05-29 NOTE — Progress Notes (Deleted)
Cardiology Office Note:    Date:  05/29/2023   ID:  Julie Lewis, DOB [birth year], MRN [id number]  PCP:  Joaquim Nam, MD   [facility] Providers Cardiologist:  Maisie Fus, MD { Click to update primary MD,subspecialty MD or APP then REFRESH:1}    Referring MD: Joaquim Nam, MD   No chief complaint on file. Syncope  History of Present Illness:    Julie Lewis is a 67 y.o. female with a hx of ITP, melanoma,  HTN, T2DM referral for syncope and noted low BP. She notes   She had an echo 05/26/2023 showing normal LV/RV fxn, no valve dx.She's had a stress echo in 2022 that was negative for ischemia.    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    Past Surgical History:  Procedure Laterality Date   COLONOSCOPY     COLONOSCOPY WITH PROPOFOL N/A 12/11/2020   Procedure: COLONOSCOPY WITH PROPOFOL;  Surgeon: Pasty Spillers, MD;  Location: [facility] ENDOSCOPY;  Service: Endoscopy;  Laterality: N/A;   GALLBLADDER SURGERY     hemmorriod sugery     LEG SURGERY     MELANOMA EXCISION     OVARIAN CYST REMOVAL     Pyogenic granuloma excision      Current Medications: No outpatient medications have been marked as taking for the 05/29/23 encounter (Appointment) with Maisie Fus, MD.     Allergies:   Bupropion, Erythromycin, Epinephrine, Iodinated contrast media, and Penicillins   Social History   Socioeconomic History   Marital status: Married    Spouse name: Not on file   Number of children: Not on file   Years of education: Not on file   Highest education level: Not on file  Occupational History   Not on file  Tobacco Use   Smoking status: Never    Passive exposure: Past (as a child)   Smokeless tobacco: Never  Vaping Use   Vaping Use: Never used  Substance and  Sexual Activity   Alcohol use: Yes    Comment: occas   Drug use: No   Sexual activity: Never    Birth control/protection: Post-menopausal  Other Topics Concern   Not on file  Social History Narrative   Married 1983.   Attended [employer] in [place].   Left handed   Social Determinants of Health   Financial Resource Strain: Not on file  Food Insecurity: Not on file  Transportation Needs: Not on file  Physical Activity: Not on file  Stress: Not on file  Social Connections: Not on file     Family History: The patient's family history includes Breast cancer in her cousin; Diabetes in her mother; Heart attack in her sister; Heart failure in her father and mother; Hypertension in her brother and father; Tremor in her sister. There is no history of Colon cancer.  ROS:   Please see the history of present illness.     All other systems reviewed and are negative.  EKGs/Labs/Other Studies Reviewed:    The following studies were reviewed today:  TTE 05/26/2023 EF 60-65%/RV. Normal strain No valve dx  Echo stress 01/29/2021 1. LVEF 60%, grade 1 DD, no significant valve disease  2.  This is a negative stress echocardiogram for ischemia.  3. This is a low risk study.    EKG:  EKG is *** ordered today.  The ekg ordered today demonstrates ***  Recent Labs: 05/04/2023: ALT 24; BUN 13; Creatinine, Ser 0.83; Hemoglobin 13.8; Platelets 149.0; Potassium 4.7; Sodium 140; TSH 1.26  Recent Lipid Panel No results found for: "CHOL", "TRIG", "HDL", "CHOLHDL", "VLDL", "LDLCALC", "LDLDIRECT"   Risk Assessment/Calculations:     Physical Exam:    VS:  LMP 08/03/2020     Wt Readings from Last 3 Encounters:  05/19/23 160 lb (72.6 kg)  05/06/23 160 lb (72.6 kg)  05/04/23 159 lb (72.1 kg)     GEN: *** Well nourished, well developed in no acute distress HEENT: Normal NECK: No JVD; No carotid bruits LYMPHATICS: No lymphadenopathy CARDIAC: ***RRR, no murmurs, rubs,  gallops RESPIRATORY:  Clear to auscultation without rales, wheezing or rhonchi  ABDOMEN: Soft, non-tender, non-distended MUSCULOSKELETAL:  No edema; No deformity  SKIN: Warm and dry NEUROLOGIC:  Alert and oriented x 3 PSYCHIATRIC:  Normal affect   ASSESSMENT:    Syncope PLAN:    In order of problems listed above:  Pre[employer] monitor      {Are you ordering a CV Procedure (e.g. stress test, cath, DCCV, TEE, etc)?   Press F2        :[id number]}    Medication Adjustments/Labs and Tests Ordered: Current medicines are reviewed at length with the patient today.  Concerns regarding medicines are outlined above.  No orders of the defined types were placed in this encounter.  No orders of the defined types were placed in this encounter.   There are no Patient Instructions on file for this visit.   Signed, Maisie Fus, MD  05/29/2023 12:07 PM    [facility]

## 2023-06-01 ENCOUNTER — Telehealth: Payer: Self-pay | Admitting: Family Medicine

## 2023-06-01 NOTE — Telephone Encounter (Signed)
Patient's husband contacted the office. Stated the patient is not doing well. Says she has been admitted to the ER, patient's spouse wanted to know if he could have a call back regarding Dr. Lianne Bushy thoughts on the ECG the patient had done. Please advise, thank you.

## 2023-06-01 NOTE — Telephone Encounter (Signed)
Returned call to husband.  He reported that she has fatigue and doesn't feel well in the AMs but feels better by afternoon.  There wasn't a clear trigger with her AM meds.    He was asking about echo report, not EKG. Discussed prev report.   Discussed cards eval.  Per EMR, patient was called but had to LMOVM.  Verified phone number, [phone].  Patient still needs cardiology appointment.  I will route to referral department re: calling patient for cards appointment.  I thank all involved.

## 2023-06-02 NOTE — Telephone Encounter (Signed)
Per referral department, another call went to patient re: cards appointment but they had to leave message on voice mail.  I thank all involved.

## 2023-06-08 ENCOUNTER — Emergency Department (HOSPITAL_COMMUNITY): Payer: Medicare PPO

## 2023-06-08 ENCOUNTER — Emergency Department (HOSPITAL_COMMUNITY)
Admission: EM | Admit: 2023-06-08 | Discharge: 2023-06-09 | Disposition: A | Discharge status: Home / Self Care | Payer: Medicare PPO | Attending: Emergency Medicine | Admitting: Emergency Medicine

## 2023-06-08 ENCOUNTER — Encounter (HOSPITAL_COMMUNITY): Payer: Self-pay | Admitting: Emergency Medicine

## 2023-06-08 ENCOUNTER — Other Ambulatory Visit: Payer: Self-pay

## 2023-06-08 DIAGNOSIS — I951 Orthostatic hypotension: Secondary | ICD-10-CM

## 2023-06-08 DIAGNOSIS — R55 Syncope and collapse: Secondary | ICD-10-CM

## 2023-06-08 DIAGNOSIS — R0602 Shortness of breath: Secondary | ICD-10-CM | POA: Diagnosis not present

## 2023-06-08 DIAGNOSIS — I10 Essential (primary) hypertension: Secondary | ICD-10-CM | POA: Insufficient documentation

## 2023-06-08 DIAGNOSIS — Z85820 Personal history of malignant melanoma of skin: Secondary | ICD-10-CM | POA: Insufficient documentation

## 2023-06-08 LAB — BASIC METABOLIC PANEL
Anion gap: 9 (ref 5–15)
BUN: 8 mg/dL (ref 8–23)
CO2: 25 mmol/L (ref 22–32)
Calcium: 8.9 mg/dL (ref 8.9–10.3)
Chloride: 100 mmol/L (ref 98–111)
Creatinine, Ser: 0.73 mg/dL (ref 0.44–1.00)
GFR, Estimated: >60 mL/min (ref >60)
Glucose, Bld: 86 mg/dL (ref 70–99)
Potassium: 3.2 mmol/L — ABNORMAL LOW (ref 3.5–5.1)
Sodium: 134 mmol/L — ABNORMAL LOW (ref 135–145)

## 2023-06-08 LAB — CBC
HCT: 38 % (ref 36.0–46.0)
Hemoglobin: 14 g/dL (ref 12.0–15.0)
MCH: 34.1 pg — ABNORMAL HIGH (ref 26.0–34.0)
MCHC: 36.8 g/dL — ABNORMAL HIGH (ref 30.0–36.0)
MCV: 92.5 fL (ref 80.0–100.0)
Platelets: 143 10*3/uL — ABNORMAL LOW (ref 150–400)
RBC: 4.11 MIL/uL (ref 3.87–5.11)
RDW: 11.9 % (ref 11.5–15.5)
WBC: 5.6 10*3/uL (ref 4.0–10.5)
nRBC: 0 % (ref 0.0–0.2)

## 2023-06-08 LAB — URINALYSIS, W/ REFLEX TO CULTURE (INFECTION SUSPECTED)
Bilirubin Urine: NEGATIVE
Glucose, UA: NEGATIVE mg/dL
Hgb urine dipstick: NEGATIVE
Ketones, ur: NEGATIVE mg/dL
Nitrite: NEGATIVE
Protein, ur: NEGATIVE mg/dL
Specific Gravity, Urine: 1.006 (ref 1.005–1.030)
pH: 6 (ref 5.0–8.0)

## 2023-06-08 LAB — D-DIMER, QUANTITATIVE: D-Dimer, Quant: 0.46 ug/mL-FEU (ref 0.00–0.50)

## 2023-06-08 LAB — TROPONIN I (HIGH SENSITIVITY): Troponin I (High Sensitivity): 4 ng/L (ref <18)

## 2023-06-08 LAB — BRAIN NATRIURETIC PEPTIDE: B Natriuretic Peptide: 123.2 pg/mL — ABNORMAL HIGH (ref 0.0–100.0)

## 2023-06-08 MED ORDER — NITROGLYCERIN 0.4 MG SL SUBL
0.4000 mg | SUBLINGUAL_TABLET | SUBLINGUAL | Status: DC | PRN
  Administered 2023-06-08 (×2): 0.4 mg via SUBLINGUAL
  Filled 2023-06-08: qty 1

## 2023-06-08 NOTE — ED Notes (Signed)
Pt B/P extremely elevated Julie Duke MD messaged via epic message; message viewed, awaiting response

## 2023-06-08 NOTE — ED Provider Notes (Signed)
[place] EMERGENCY DEPARTMENT AT [facility] Provider Note   HPI: Julie Lewis is a 67 year old female with a past medical history as below presenting today with chest pain.  She also endorses several syncopal episodes over the last week.  She reports the chest pain is a pressure-like pain.  She has had some associated shortness of breath.  She denies worsening with inspiration.  She denies any syncopal episodes today.  She endorses today she had acute onset hypertension and does not take any antihypertensives therefore she presented for evaluation.  She denies a history of PE/DVT, recent surgery, immobilization, hemoptysis.  She denies cough, fevers, chills.  She was primarily concerned for her hypertension with chest pressure that sure she presented to the hospital.  She also endorses intermittent lightheadedness and posterior head pressure.  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    Past Surgical History:  Procedure Laterality Date   COLONOSCOPY     COLONOSCOPY WITH PROPOFOL N/A 12/11/2020   Procedure: COLONOSCOPY WITH PROPOFOL;  Surgeon: Pasty Spillers, MD;  Location: [facility] ENDOSCOPY;  Service: Endoscopy;  Laterality: N/A;   GALLBLADDER SURGERY     hemmorriod sugery     LEG SURGERY     MELANOMA EXCISION     OVARIAN CYST REMOVAL     Pyogenic granuloma excision       Social History   Tobacco Use   Smoking status: Never    Passive exposure: Past (as a child)   Smokeless tobacco: Never  Vaping Use   Vaping Use: Never used  Substance Use Topics   Alcohol use: Yes    Comment: occas   Drug use: No      Review of Systems  A complete ROS was performed with pertinent positives/negatives noted in the HPI.   Vitals:   06/08/23 2305 06/08/23 2310  BP: (!) 176/97 127/89   Pulse: 64 75  Resp: 15 20  Temp:    SpO2: 100% 97%    Physical Exam Vitals and nursing note reviewed.  Constitutional:      General: She is not in acute distress.    Appearance: She is well-developed. She is not ill-appearing.  Eyes:     Conjunctiva/sclera: Conjunctivae normal.  Cardiovascular:     Rate and Rhythm: Normal rate and regular rhythm.     Pulses: Normal pulses.     Heart sounds: Normal heart sounds. No murmur heard.    No friction rub. No gallop.  Pulmonary:     Effort: Pulmonary effort is normal. No respiratory distress.     Breath sounds: Normal breath sounds. No stridor. No wheezing, rhonchi or rales.  Abdominal:     General: Abdomen is flat. There is no distension.     Palpations: Abdomen is soft.     Tenderness: There is no abdominal tenderness. There is no guarding or rebound.  Musculoskeletal:        General: No swelling.     Cervical back: Neck supple.  Skin:    General: Skin is warm and dry.     Capillary Refill: Capillary refill takes less than 2 seconds.  Neurological:     Mental Status: She is alert.     Sensory: Sensation  is intact. No sensory deficit.     Motor: Motor function is intact. No weakness, tremor, atrophy, abnormal muscle tone, seizure activity or pronator drift.     Coordination: Coordination is intact. Coordination normal. Finger-Nose-Finger Test normal.     Comments: CRANIAL NERVES: 2 (Optic) - Visual fields intact. PERRL brisk.  3/4/6 (Oculomotor, Trochlear, Abducens) - EOM full without nystagmus 5 (Trigeminal) - sensation intact to light touch 7 (Facial) - No facial weakness or asymmetry.  8 (Vestibulococchlear) - responds to voice, auditory acuity grossly normal 9/10 (Glossopharyngeal) - symmetric palate and uvula elevation 11 (Spinal accessory) - head midline, Normal and symmetric sternocleidomastoid and trapezius strength  12 (Hypoglossal) - tongue midline      Procedures  MDM:  Imaging/radiology results:  DG Chest 2  View  Result Date: 06/08/2023 CLINICAL DATA:  Chest pain EXAM: CHEST - 2 VIEW COMPARISON:  None Available. FINDINGS: The heart size and mediastinal contours are within normal limits. Both lungs are clear. The visualized skeletal structures are unremarkable. IMPRESSION: No active cardiopulmonary disease. Electronically Signed   By: Alcide Clever M.D.   On: 06/08/2023 22:07     EKG results: ECG on my interpretation is normal sinus rhythm and rate, without anatomical ischemia representing STEMI, New-onset Arrhythmia, or ischemic equivalent.    Lab results:  Results for orders placed or performed during the hospital encounter of 06/08/23 (from the past 24 hour(s))  Basic metabolic panel     Status: Abnormal   Collection Time: 06/08/23 10:13 PM  Result Value Ref Range   Sodium 134 (L) 135 - 145 mmol/L   Potassium 3.2 (L) 3.5 - 5.1 mmol/L   Chloride 100 98 - 111 mmol/L   CO2 25 22 - 32 mmol/L   Glucose, Bld 86 70 - 99 mg/dL   BUN 8 8 - 23 mg/dL   Creatinine, Ser 2.44 0.44 - 1.00 mg/dL   Calcium 8.9 8.9 - 01.0 mg/dL   GFR, Estimated >27 >25 mL/min   Anion gap 9 5 - 15  CBC     Status: Abnormal   Collection Time: 06/08/23 10:13 PM  Result Value Ref Range   WBC 5.6 4.0 - 10.5 K/uL   RBC 4.11 3.87 - 5.11 MIL/uL   Hemoglobin 14.0 12.0 - 15.0 g/dL   HCT 36.6 44.0 - 34.7 %   MCV 92.5 80.0 - 100.0 fL   MCH 34.1 (H) 26.0 - 34.0 pg   MCHC 36.8 (H) 30.0 - 36.0 g/dL   RDW 42.5 95.6 - 38.7 %   Platelets 143 (L) 150 - 400 K/uL   nRBC 0.0 0.0 - 0.2 %  Troponin I (High Sensitivity)     Status: None   Collection Time: 06/08/23 10:13 PM  Result Value Ref Range   Troponin I (High Sensitivity) 4 <18 ng/L  Brain natriuretic peptide     Status: Abnormal   Collection Time: 06/08/23 10:13 PM  Result Value Ref Range   B Natriuretic Peptide 123.2 (H) 0.0 - 100.0 pg/mL     Key medications administered in the ER:  Medications  nitroGLYCERIN (NITROSTAT) SL tablet 0.4 mg (0.4 mg Sublingual Given 06/08/23  2303)    Medical decision making: -Vital signs stable. Patient afebrile, hemodynamically stable, and non-toxic appearing. -Patient's presentation is most consistent with acute complicated illness / injury requiring diagnostic workup.. Julie Lewis is a 67 y.o. female presenting to the emergency department with chest pain.  She also endorses generalized weakness.  She reports she typically suffers from  hypertension but has been hypertensive in the setting of coming off her propranolol which she was told by her doctors to discontinue.  Chest x-ray has been obtained and on my interpretation is without pneumothorax, pneumonia, or other acute cardiopulmonary abnormality.  EKG on my interpretation is without evidence of STEMI, new-onset arrhythmia, or ischemic equivalent.  Will pursue serial troponins to further evaluate chest pain.  Initial troponin  is normal at is normal at 4 with repeat troponin pending.  Overall have a low suspicion for ACS however will further characterize with delta troponins.  Will also with stratify for PE.  Well score is low risk will obtain D-dimer to further characterize for PE given syncope with chest pain.  This is currently pending.  Patient has no urinary symptoms, UA is without evidence of UTI, doubt UTI as a cause of her generalized weakness.  CBC does not show evidence of anemia or other acute hematologic abnormality to explain her generalized weakness.  BMP does not show evidence of severe metabolic derangement that would explain her symptoms.  BNP is normal at 123, chest x-ray is without evidence of pulmonary edema, do not suspect CHF exacerbation.  Will obtain CT head to further evaluate for intracranial abnormality given patient's hypertension with headaches and intermittent visual changes.  Plan at signout is to follow-up delta troponin, D-dimer, and CT head which are currently pending and to reassess the patient. Pt care was handed off to oncoming team at 2330.   Complete history and physical and current plan have been communicated.  Please refer to their note for the remainder of ED care and ultimate disposition.     Medical Decision Making Amount and/or Complexity of Data Reviewed Independent Historian: spouse External Data Reviewed: notes. Labs: ordered. Decision-making details documented in ED Course. Radiology: ordered and independent interpretation performed. Decision-making details documented in ED Course. ECG/medicine tests: ordered and independent interpretation performed. Decision-making details documented in ED Course.  Risk Prescription drug management.     The plan for this patient was discussed with Dr. Posey Rea, who voiced agreement and who oversaw evaluation and treatment of this patient.  Marta Lamas, MD Emergency Medicine, PGY-3  Note: Dragon medical dictation software was used in the creation of this note.   Clinical Impression:  1. Orthostatic hypotension   2. Syncope, unspecified syncope type          Chase Caller, MD 06/09/23 1118    Kommor, Dennis, MD 06/10/23 [phone]

## 2023-06-08 NOTE — ED Triage Notes (Signed)
Pt c/o chest pain, hypertension, and 3 syncopal episodes in the last 24 hours. She states that she is either walking upstairs or standing from a squatting position when she has the syncopal episodes. Pt states that she was tapered off of her propanolol after being on it for 35 years for tremors.

## 2023-06-09 LAB — TROPONIN I (HIGH SENSITIVITY): Troponin I (High Sensitivity): 5 ng/L (ref <18)

## 2023-06-09 MED ORDER — PROPRANOLOL HCL 10 MG PO TABS
20.0000 mg | ORAL_TABLET | Freq: Once | ORAL | Status: AC
  Administered 2023-06-09: 20 mg via ORAL
  Filled 2023-06-09: qty 2

## 2023-06-09 MED ORDER — LACTATED RINGERS IV BOLUS
1000.0000 mL | Freq: Once | INTRAVENOUS | Status: AC
  Administered 2023-06-09: 1000 mL via INTRAVENOUS

## 2023-06-09 NOTE — ED Provider Notes (Signed)
Physical Exam  BP (!) 164/99   Pulse (!) 57   Temp 98.1 F (36.7 C)   Resp 17   Ht 5' 5.5" (1.664 m)   Wt 69.9 kg   LMP 08/03/2020   SpO2 98%   BMI 25.24 kg/m   Physical Exam Vitals and nursing note reviewed.  Constitutional:      General: She is not in acute distress.    Appearance: She is well-developed.  HENT:     Head: Normocephalic and atraumatic.  Eyes:     Conjunctiva/sclera: Conjunctivae normal.  Cardiovascular:     Rate and Rhythm: Normal rate and regular rhythm.     Heart sounds: No murmur heard. Pulmonary:     Effort: Pulmonary effort is normal. No respiratory distress.     Breath sounds: Normal breath sounds.  Abdominal:     Palpations: Abdomen is soft.     Tenderness: There is no abdominal tenderness.  Musculoskeletal:        General: No swelling.     Cervical back: Neck supple.  Skin:    General: Skin is warm and dry.     Capillary Refill: Capillary refill takes less than 2 seconds.  Neurological:     Mental Status: She is alert.  Psychiatric:        Mood and Affect: Mood normal.     Procedures  .Critical Care  Performed by: Glendora Score, MD Authorized by: Glendora Score, MD   Critical care provider statement:    Critical care time (minutes):  30   Critical care was necessary to treat or prevent imminent or life-threatening deterioration of the following conditions:  Circulatory failure and dehydration   Critical care was time spent personally by me on the following activities:  Development of treatment plan with patient or surrogate, discussions with consultants, evaluation of patient's response to treatment, examination of patient, ordering and review of laboratory studies, ordering and review of radiographic studies, ordering and performing treatments and interventions, pulse oximetry, re-evaluation of patient's condition and review of old charts   ED Course / MDM    Medical Decision Making Amount and/or Complexity of Data  Reviewed Labs: ordered. Radiology: ordered.  Risk Prescription drug management.   Patient received in handoff from the resident.  Patient arrives with multiple complaints including chest pain, generalized fatigue and syncope.  Laboratory workup is overall reassuring outside of some mild hypokalemia to 3.2 and a BNP of 123 but chest x-ray without pulmonary edema, D-dimer and troponin are normal.  After an extensive discussion with the patient and review of her blood pressure logs, it appears the patient only suffers hypotension when standing and she likely has orthostatic hypotension.  She was persistently hypertensive here in the emergency department and states that she has been off of her propranolol recently because it is because hypotension and thus we trialed a very small dose of propranolol here in the emergency department with improvement of the patient's blood pressures.  However, patient then failed orthostatic vital signs dropping her blood pressures into the 80s and was symptomatic.  She received 1 L of fluids and her symptoms have completely resolved.  Of note, patient also states that she feels very tired throughout the day but in the evening has a burst of energy and feels much better and I wonder if this is secondary to her morning medications wearing off and the primidone causing sedation.  She will reach out to her neurologist tomorrow to ask if she can hold the  primidone for a day and monitor for improvement.  I sent an outpatient referral to cardiology to help the patient with propranolol use as this appears to help her essential tremors but is complicated by orthostatic hypotension.  With symptoms resolved, she is safe for discharge with outpatient follow-up       Glendora Score, MD 06/09/23 [phone]

## 2023-06-09 NOTE — Discharge Instructions (Addendum)
You were seen in the emergency room for evaluation of syncope and orthostatic hypotension.  Here in the emergency room, your laboratory workup was reassuring, and it appears that the symptoms you have been experiencing are secondary to orthostatic hypotension which is when you drop your blood pressure and response to changes in position.  This seemed to resolve after he received IV fluids and thus it would be important that you document your water intake while we help arrange outpatient follow-up for you.  For now, please call your neurologist tomorrow morning to discuss possibly holding your primidone for a day and seeing if your symptoms improve in regards to your general lethargy.  Restarting your propranolol will likely need to be done in coordination with a cardiologist and I have sent an urgent referral.  Please call the number above and tell them that you went to the emergency department and you are requesting urgent follow-up.  At this time you are safe for discharge but please be very careful when transferring positions especially going from sitting to standing.  Return to the emergency department if symptoms worsen.

## 2023-06-09 NOTE — ED Notes (Signed)
Patient left before blood pressure taken

## 2023-06-09 NOTE — ED Notes (Addendum)
While doing the orthostatic vital signs, Pt got to the standing portion and felt dizzy so we stopped.

## 2023-06-10 ENCOUNTER — Encounter: Payer: Self-pay | Admitting: Internal Medicine

## 2023-06-10 ENCOUNTER — Ambulatory Visit: Payer: Medicare PPO | Admitting: Internal Medicine

## 2023-06-10 ENCOUNTER — Ambulatory Visit: Payer: Medicare PPO | Attending: Cardiology | Admitting: Internal Medicine

## 2023-06-10 ENCOUNTER — Ambulatory Visit (INDEPENDENT_AMBULATORY_CARE_PROVIDER_SITE_OTHER): Payer: Medicare PPO

## 2023-06-10 VITALS — BP 128/82 | HR 68 | Ht 65.0 in | Wt 155.6 lb

## 2023-06-10 DIAGNOSIS — R55 Syncope and collapse: Secondary | ICD-10-CM

## 2023-06-10 DIAGNOSIS — I951 Orthostatic hypotension: Secondary | ICD-10-CM | POA: Diagnosis not present

## 2023-06-10 MED ORDER — MIDODRINE HCL 2.5 MG PO TABS
2.5000 mg | ORAL_TABLET | Freq: Three times a day (TID) | ORAL | Status: DC
Start: 2023-06-10 — End: 2023-08-18

## 2023-06-10 NOTE — Progress Notes (Unsigned)
Enrolled patient for a 7 day Zio XT monitor to be mailed to patients home.  

## 2023-06-10 NOTE — Patient Instructions (Signed)
Medication Instructions:   START MIDODRINE 2.5 THREE TIMES DAILY *If you need a refill on your cardiac medications before your next appointment, please call your pharmacy*   Lab Work:  NO LABS ORDERED TODAY If you have labs (blood work) drawn today and your tests are completely normal, you will receive your results only by: MyChart Message (if you have MyChart) OR A paper copy in the mail If you have any lab test that is abnormal or we need to change your treatment, we will call you to review the results.   Testing/Procedures:  Christena Deem- Long Term Monitor Instructions   Your physician has requested you wear your ZIO patch monitor___7____days.   This is a single patch monitor.  [employer] supplies one patch monitor per enrollment.  Additional stickers are not available.   Please do not apply patch if you will be having a Nuclear Stress Test, Echocardiogram, Cardiac CT, MRI, or Chest Xray during the time frame you would be wearing the monitor. The patch cannot be worn during these tests.  You cannot remove and re-apply the ZIO XT patch monitor.   Your ZIO patch monitor will be sent USPS Priority mail from [facility] directly to your home address. The monitor may also be mailed to a PO BOX if home delivery is not available.   It may take 3-5 days to receive your monitor after you have been enrolled.   Once you have received you monitor, please review enclosed instructions.  Your monitor has already been registered assigning a specific monitor serial # to you.   Applying the monitor   Shave hair from upper left chest.   Hold abrader disc by orange tab.  Rub abrader in 40 strokes over left upper chest as indicated in your monitor instructions.   Clean area with 4 enclosed alcohol pads .  Use all pads to assure are is cleaned thoroughly.  Let dry.   Apply patch as indicated in monitor instructions.  Patch will be place under collarbone on left side of chest with arrow pointing  upward.   Rub patch adhesive wings for 2 minutes.Remove white label marked "1".  Remove white label marked "2".  Rub patch adhesive wings for 2 additional minutes.   While looking in a mirror, press and release button in center of patch.  A small green light will flash 3-4 times .  This will be your only indicator the monitor has been turned on.     Do not shower for the first 24 hours.  You may shower after the first 24 hours.   Press button if you feel a symptom. You will hear a small click.  Record Date, Time and Symptom in the Patient Log Book.   When you are ready to remove patch, follow instructions on last 2 pages of Patient Log Book.  Stick patch monitor onto last page of Patient Log Book.   Place Patient Log Book in [place] box.  Use locking tab on box and tape box closed securely.  The [employer] and [employer] has [employer] on it.  Please place in mailbox as soon as possible.  Your physician should have your test results approximately 7 days after the monitor has been mailed back to [facility].   Call [facility] Customer Care at [phone] if you have questions regarding your ZIO XT patch monitor.  Call them immediately if you see an orange light blinking on your monitor.   If your monitor falls off in less  than 4 days contact our Monitor department at [phone].  If your monitor becomes loose or falls off after 4 days call [employer] at [phone] for suggestions on securing your monitor.     Follow-Up: At [facility], you and your health needs are our priority.  As part of our continuing mission to provide you with exceptional heart care, we have created designated Provider Care Teams.  These Care Teams include your primary Cardiologist (physician) and Advanced Practice Providers (APPs -  Physician Assistants and Nurse Practitioners) who all work together to provide you with the care you need, when you need it.  We recommend signing up for the patient portal  called "MyChart".  Sign up information is provided on this After Visit Summary.  MyChart is used to connect with patients for Virtual Visits (Telemedicine).  Patients are able to view lab/test results, encounter notes, upcoming appointments, etc.  Non-urgent messages can be sent to your provider as well.   To learn more about what you can do with MyChart, go to [email or web address].    Your next appointment:      Provider:   Maisie Fus, MD

## 2023-06-10 NOTE — Progress Notes (Signed)
Cardiology Office Note:    Date:  06/10/2023   ID:  Julie Lewis, DOB 05-13-1956, MRN [id number]  PCP:  Joaquim Nam, MD   [facility] Providers Cardiologist:  Maisie Fus, MD     Referring MD: Joaquim Nam, MD   No chief complaint on file. Orthostatic hypotension  History of Present Illness:    Julie Lewis is a 67 y.o. female with a hx of hx of ITP, referral for ?orthostatic hypotension. He went to the ED yesterday vitals were stable BP 164/99 mmHg. He had a constellation of symptoms including CP, fatigue and syncope. Says syncope is chronic and associated with sitting and standing. No hx of cardiac dx. Notes her Bps wax and wane. No signs of infection. She's on propanolol for essential tremors. They recommended a cardiology visit to discuss propanolol which we did not prescribe. She had + orthostatics in the ED. She is tearful in the room. Echo showed EF 60-65%, nl RV, no valve dx. She would like to see Dr. Anne Fu who also sees her husband.  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    Past Surgical History:  Procedure Laterality Date   COLONOSCOPY     COLONOSCOPY WITH PROPOFOL N/A 12/11/2020   Procedure: COLONOSCOPY WITH PROPOFOL;  Surgeon: Pasty Spillers, MD;  Location: [facility] ENDOSCOPY;  Service: Endoscopy;  Laterality: N/A;   GALLBLADDER SURGERY     hemmorriod sugery     LEG SURGERY     MELANOMA EXCISION     OVARIAN CYST REMOVAL     Pyogenic granuloma excision      Current Medications: Current Meds  Medication Sig   atorvastatin (LIPITOR) 40 MG tablet Take 40 mg by mouth daily.   B Complex Vitamins (VITAMIN B COMPLEX PO) Take by mouth.   citalopram (CELEXA) 10 MG tablet Take 10 mg by  mouth daily.   Flaxseed Oil (LINSEED OIL) OIL    meloxicam (MOBIC) 7.5 MG tablet Take 1 tablet (7.5 mg total) by mouth daily as needed for pain.   Multiple Vitamins-Minerals (MULTIVITAMIN GUMMIES ADULT PO) Take by mouth.   primidone (MYSOLINE) 250 MG tablet Take 1 tablet (250 mg total) by mouth in the morning and at bedtime.   Resveratrol 100 MG CAPS Take 1 tablet by mouth every morning.   traMADol (ULTRAM) 50 MG tablet Take 1 tablet (50 mg total) by mouth every 12 (twelve) hours as needed.   valACYclovir (VALTREX) 500 MG tablet Take 1 tablet (500 mg total) by mouth 2 (two) times daily as needed.     Allergies:   Bupropion, Erythromycin, Epinephrine, Iodinated contrast media, and Penicillins   Social History   Socioeconomic History   Marital status: Married    Spouse name: Not on file   Number of children: Not on file   Years of education: Not on file   Highest education level: Not on file  Occupational History   Not on file  Tobacco Use   Smoking status: Never    Passive exposure: Past (as a child)   Smokeless tobacco: Never  Vaping Use   Vaping  Use: Never used  Substance and Sexual Activity   Alcohol use: Yes    Comment: occas   Drug use: No   Sexual activity: Never    Birth control/protection: Post-menopausal  Other Topics Concern   Not on file  Social History Narrative   Married 1983.   Attended [employer] in [place].   Left handed   Social Determinants of Health   Financial Resource Strain: Not on file  Food Insecurity: Not on file  Transportation Needs: Not on file  Physical Activity: Not on file  Stress: Not on file  Social Connections: Not on file     Family History: The patient's family history includes Breast cancer in her cousin; Diabetes in her mother; Heart attack in her sister; Heart failure in her father and mother; Hypertension in her brother and father; Tremor in her sister. There is no history of Colon cancer.  ROS:   Please see the  history of present illness.     All other systems reviewed and are negative.  EKGs/Labs/Other Studies Reviewed:    The following studies were reviewed today:       Recent Labs: 05/04/2023: ALT 24; TSH 1.26 06/08/2023: B Natriuretic Peptide 123.2; BUN 8; Creatinine, Ser 0.73; Hemoglobin 14.0; Platelets 143; Potassium 3.2; Sodium 134   Recent Lipid Panel No results found for: "CHOL", "TRIG", "HDL", "CHOLHDL", "VLDL", "LDLCALC", "LDLDIRECT"   Risk Assessment/Calculations:     Physical Exam:    VS:   06/10/2023- NSR  LMP 08/03/2020     Wt Readings from Last 3 Encounters:  06/08/23 154 lb (69.9 kg)  05/19/23 160 lb (72.6 kg)  05/06/23 160 lb (72.6 kg)     GEN:  Well nourished, well developed in no acute distress HEENT: Normal NECK: No JVD; No carotid bruits CARDIAC: RRR, no murmurs, rubs, gallops RESPIRATORY:  Clear to auscultation without rales, wheezing or rhonchi  ABDOMEN: Soft, non-tender, non-distended MUSCULOSKELETAL:  No edema; No deformity  SKIN: Warm and dry NEUROLOGIC:  Alert and oriented x 3 PSYCHIATRIC:  Normal affect   ASSESSMENT:    1. Orthostatic hypotension   Patient would like to continue her propanolol for her tremors.  Can trial midodrine 2.5 mg TID with + orthostatics in the ED. Will also get a cardiac monitor to ensure syncopal episodes are not related to arrhythmia PLAN:    In order of problems listed above:  Trial midodrine 2.5 mg TID 7 day ziopatch Follow up with PRN  Medication Adjustments/Labs and Tests Ordered: Current medicines are reviewed at length with the patient today.  Concerns regarding medicines are outlined above.  Orders Placed This Encounter  Procedures   EKG 12-Lead   No orders of the defined types were placed in this encounter.   There are no Patient Instructions on file for this visit.   Signed, Maisie Fus, MD  06/10/2023 2:41 PM    [facility]

## 2023-06-16 DIAGNOSIS — R55 Syncope and collapse: Secondary | ICD-10-CM

## 2023-06-19 ENCOUNTER — Telehealth: Payer: Self-pay | Admitting: Family Medicine

## 2023-06-19 NOTE — Telephone Encounter (Signed)
Pt called to let Dr. Para March she now is wearing a heart monitor. Call back # [phone]

## 2023-06-21 NOTE — Telephone Encounter (Signed)
Noted, will await report.  

## 2023-08-18 ENCOUNTER — Encounter: Payer: Self-pay | Admitting: Cardiology

## 2023-08-18 ENCOUNTER — Ambulatory Visit: Payer: Medicare PPO | Admitting: Cardiology

## 2023-08-18 ENCOUNTER — Ambulatory Visit: Payer: Medicare PPO | Attending: Cardiology | Admitting: Cardiology

## 2023-08-18 VITALS — BP 122/82 | HR 55 | Ht 65.0 in | Wt 156.0 lb

## 2023-08-18 DIAGNOSIS — G250 Essential tremor: Secondary | ICD-10-CM

## 2023-08-18 DIAGNOSIS — R55 Syncope and collapse: Secondary | ICD-10-CM

## 2023-08-18 DIAGNOSIS — I951 Orthostatic hypotension: Secondary | ICD-10-CM

## 2023-08-18 MED ORDER — MIDODRINE HCL 2.5 MG PO TABS: 2.5000 mg | ORAL_TABLET | Freq: Three times a day (TID) | ORAL | 11 refills | Status: DC

## 2023-08-18 NOTE — Progress Notes (Signed)
Cardiology Office Note:    Date:  08/18/2023   ID:  Julie Lewis, DOB [birth year], MRN [id number]  PCP:  Joaquim Nam, MD   [facility] Providers Cardiologist:  Donato Schultz, MD     Referring MD: Joaquim Nam, MD    History of Present Illness:    Julie Lewis is a 67 y.o. female Discussed the use of AI scribe software for clinical note transcription with the patient, who gave verbal consent to proceed.  History of Present Illness   The patient, with a history of orthostatic hypotension, ITP, and essential tremors, presents with chronic chest pain, fatigue, and fainting spells. The fainting spells are particularly pronounced when transitioning from a seated or lying position to standing. The patient has been managing the essential tremors with propranolol for 35 years, but recently discontinued the medication due to concerns about its impact on heart rate. This led to a significant increase in tremors and emotional distress, prompting the patient to restart the medication at a lower dose. The patient also takes primidone and gabapentin for the tremors. The patient has a family history of both essential tremors and Parkinson's disease, and is concerned about the possibility of developing Parkinson's.       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    Past Surgical History:  Procedure Laterality Date   COLONOSCOPY     COLONOSCOPY WITH PROPOFOL N/A 12/11/2020   Procedure: COLONOSCOPY WITH PROPOFOL;  Surgeon: Pasty Spillers, MD;  Location: [facility] ENDOSCOPY;  Service: Endoscopy;  Laterality: N/A;   GALLBLADDER SURGERY     hemmorriod sugery     LEG SURGERY     MELANOMA EXCISION     OVARIAN CYST REMOVAL      Pyogenic granuloma excision      Current Medications: Current Meds  Medication Sig   atorvastatin (LIPITOR) 40 MG tablet Take 40 mg by mouth daily.   B Complex Vitamins (VITAMIN B COMPLEX PO) Take by mouth.   citalopram (CELEXA) 10 MG tablet Take 20 mg by mouth daily.   Flaxseed Oil (LINSEED OIL) OIL    gabapentin (NEURONTIN) 300 MG capsule Take 600 mg by mouth 2 (two) times daily.   meloxicam (MOBIC) 7.5 MG tablet Take 1 tablet (7.5 mg total) by mouth daily as needed for pain.   midodrine (PROAMATINE) 2.5 MG tablet Take 1 tablet (2.5 mg total) by mouth 3 (three) times daily with meals.   Multiple Vitamins-Minerals (MULTIVITAMIN GUMMIES ADULT PO) Take by mouth.   primidone (MYSOLINE) 250 MG tablet Take 1 tablet (250 mg total) by mouth in the morning and at bedtime. (Patient taking differently: Take 250 mg by mouth in the morning and at bedtime. Pt take 1/2 in morning and 1/2 in the evening)   Resveratrol 100 MG CAPS Take 1 tablet by mouth every morning.   traMADol (ULTRAM) 50 MG tablet Take 1 tablet (50 mg total) by mouth every 12 (twelve) hours as needed.   valACYclovir (VALTREX) 500 MG tablet Take 1 tablet (500 mg total) by mouth 2 (two) times daily as needed.     Allergies:  Bupropion, Erythromycin, Epinephrine, Iodinated contrast media, and Penicillins   Social History   Socioeconomic History   Marital status: Married    Spouse name: Not on file   Number of children: Not on file   Years of education: Not on file   Highest education level: Not on file  Occupational History   Not on file  Tobacco Use   Smoking status: Never    Passive exposure: Past (as a child)   Smokeless tobacco: Never  Vaping Use   Vaping status: Never Used  Substance and Sexual Activity   Alcohol use: Yes    Comment: occas   Drug use: No   Sexual activity: Never    Birth control/protection: Post-menopausal  Other Topics Concern   Not on file  Social History Narrative   Married 1983.   Attended  [employer] in [place].   Left handed   Social Determinants of Health   Financial Resource Strain: Not on file  Food Insecurity: Not on file  Transportation Needs: Not on file  Physical Activity: Not on file  Stress: Not on file  Social Connections: Not on file     Family History: The patient's family history includes Breast cancer in her cousin; Diabetes in her mother; Heart attack in her sister; Heart failure in her father and mother; Hypertension in her brother and father; Tremor in her sister. There is no history of Colon cancer.  ROS:   Please see the history of present illness.     All other systems reviewed and are negative.  EKGs/Labs/Other Studies Reviewed:    The following studies were reviewed today: - Essential tremors in the family - Parkinson's in the family  Echocardiogram: Normal left ventricular systolic function, LVH, ejection fraction 60-65%, normal right ventricle, normal valves.  ZIO 2024 - normal.   EKG:  The ekg ordered today demonstrates      Recent Labs: 05/04/2023: ALT 24; TSH 1.26 06/08/2023: B Natriuretic Peptide 123.2; BUN 8; Creatinine, Ser 0.73; Hemoglobin 14.0; Platelets 143; Potassium 3.2; Sodium 134  Recent Lipid Panel No results found for: "CHOL", "TRIG", "HDL", "CHOLHDL", "VLDL", "LDLCALC", "LDLDIRECT"   Risk Assessment/Calculations:               Physical Exam:    VS:  BP 122/82   Pulse (!) 55   Ht 5\' 5"  (1.651 m)   Wt 156 lb (70.8 kg)   LMP 08/03/2020   SpO2 95%   BMI 25.96 kg/m     Wt Readings from Last 3 Encounters:  08/18/23 156 lb (70.8 kg)  06/10/23 155 lb 9.6 oz (70.6 kg)  06/08/23 154 lb (69.9 kg)     GEN:  Well nourished, well developed in no acute distress HEENT: Normal NECK: No JVD; No carotid bruits LYMPHATICS: No lymphadenopathy CARDIAC: RRR, 1/6 SM, no rubs, gallops RESPIRATORY:  Clear to auscultation without rales, wheezing or rhonchi  ABDOMEN: Soft, non-tender,  non-distended MUSCULOSKELETAL:  No edema; No deformity  SKIN: Warm and dry NEUROLOGIC:  Alert and oriented x 3 PSYCHIATRIC:  Normal affect   ASSESSMENT:    1. Syncope, unspecified syncope type   2. Orthostatic hypotension   3. Familial tremor    PLAN:    In order of problems listed above:  Assessment and Plan    Essential Tremors Chronic condition managed with Propranolol, Primidone, and Gabapentin. Recent discontinuation of Propranolol led to significant worsening of symptoms and emotional distress. Patient self-reinstated Propranolol at a lower dose with some improvement. -Continue  Propranolol at patient's self-adjusted dose. -Consider potential Parkinson's disease and discuss with upcoming neurology appointment.  Orthostatic Hypotension Chronic episodes of dizziness and fainting, potentially related to Propranolol use. -Consider trial of Midodrine to counteract hypotensive effects of Propranolol. -Salt, compression  Depression Secondary to physical health issues, managed with Citalopram. -Continue Citalopram and monitor mood.  General Health Maintenance -Encourage hydration and salt intake to manage orthostatic hypotension. -Follow-up with neurology for further assessment of tremors and potential Parkinson's disease.                Medication Adjustments/Labs and Tests Ordered: Current medicines are reviewed at length with the patient today.  Concerns regarding medicines are outlined above.  No orders of the defined types were placed in this encounter.  Meds ordered this encounter  Medications   midodrine (PROAMATINE) 2.5 MG tablet    Sig: Take 1 tablet (2.5 mg total) by mouth 3 (three) times daily with meals.    Dispense:  90 tablet    Refill:  11    Patient Instructions  Medication Instructions:  Please start Midodrine 2.5 mg (1) tablet 3 times a day with meals. Continue all other medications as listed.  *If you need a refill on your cardiac  medications before your next appointment, please call your pharmacy*   Follow-Up: At [facility], you and your health needs are our priority.  As part of our continuing mission to provide you with exceptional heart care, we have created designated Provider Care Teams.  These Care Teams include your primary Cardiologist (physician) and Advanced Practice Providers (APPs -  Physician Assistants and Nurse Practitioners) who all work together to provide you with the care you need, when you need it.  We recommend signing up for the patient portal called "MyChart".  Sign up information is provided on this After Visit Summary.  MyChart is used to connect with patients for Virtual Visits (Telemedicine).  Patients are able to view lab/test results, encounter notes, upcoming appointments, etc.  Non-urgent messages can be sent to your provider as well.   To learn more about what you can do with MyChart, go to [email or web address].    Your next appointment:   1 year(s)  Provider:   Dr Donato Schultz         Signed, Donato Schultz, MD  08/18/2023 3:38 PM    [facility]

## 2023-08-18 NOTE — Patient Instructions (Signed)
Medication Instructions:  Please start Midodrine 2.5 mg (1) tablet 3 times a day with meals. Continue all other medications as listed.  *If you need a refill on your cardiac medications before your next appointment, please call your pharmacy*   Follow-Up: At [facility], you and your health needs are our priority.  As part of our continuing mission to provide you with exceptional heart care, we have created designated Provider Care Teams.  These Care Teams include your primary Cardiologist (physician) and Advanced Practice Providers (APPs -  Physician Assistants and Nurse Practitioners) who all work together to provide you with the care you need, when you need it.  We recommend signing up for the patient portal called "MyChart".  Sign up information is provided on this After Visit Summary.  MyChart is used to connect with patients for Virtual Visits (Telemedicine).  Patients are able to view lab/test results, encounter notes, upcoming appointments, etc.  Non-urgent messages can be sent to your provider as well.   To learn more about what you can do with MyChart, go to [email or web address].    Your next appointment:   1 year(s)  Provider:   Dr Donato Schultz

## 2024-01-29 ENCOUNTER — Ambulatory Visit (INDEPENDENT_AMBULATORY_CARE_PROVIDER_SITE_OTHER): Payer: Medicare Other | Admitting: Family Medicine

## 2024-01-29 ENCOUNTER — Encounter: Payer: Self-pay | Admitting: Family Medicine

## 2024-01-29 VITALS — BP 128/80 | HR 57 | Temp 98.4°F | Ht 64.25 in | Wt 163.2 lb

## 2024-01-29 DIAGNOSIS — G250 Essential tremor: Secondary | ICD-10-CM | POA: Diagnosis not present

## 2024-01-29 DIAGNOSIS — F32A Depression, unspecified: Secondary | ICD-10-CM

## 2024-01-29 DIAGNOSIS — E786 Lipoprotein deficiency: Secondary | ICD-10-CM | POA: Diagnosis not present

## 2024-01-29 DIAGNOSIS — R300 Dysuria: Secondary | ICD-10-CM

## 2024-01-29 DIAGNOSIS — M810 Age-related osteoporosis without current pathological fracture: Secondary | ICD-10-CM | POA: Diagnosis not present

## 2024-01-29 DIAGNOSIS — N3000 Acute cystitis without hematuria: Secondary | ICD-10-CM

## 2024-01-29 DIAGNOSIS — R55 Syncope and collapse: Secondary | ICD-10-CM | POA: Diagnosis not present

## 2024-01-29 DIAGNOSIS — C439 Malignant melanoma of skin, unspecified: Secondary | ICD-10-CM

## 2024-01-29 DIAGNOSIS — Z7189 Other specified counseling: Secondary | ICD-10-CM

## 2024-01-29 DIAGNOSIS — Z0000 Encounter for general adult medical examination without abnormal findings: Secondary | ICD-10-CM

## 2024-01-29 LAB — COMPREHENSIVE METABOLIC PANEL
ALT: 24 U/L (ref 0–35)
AST: 20 U/L (ref 0–37)
Albumin: 4.5 g/dL (ref 3.5–5.2)
Alkaline Phosphatase: 63 U/L (ref 39–117)
BUN: 12 mg/dL (ref 6–23)
CO2: 27 meq/L (ref 19–32)
Calcium: 9 mg/dL (ref 8.4–10.5)
Chloride: 105 meq/L (ref 96–112)
Creatinine, Ser: 0.77 mg/dL (ref 0.40–1.20)
GFR: 79.47 mL/min (ref >60.00)
Glucose, Bld: 77 mg/dL (ref 70–99)
Potassium: 4.5 meq/L (ref 3.5–5.1)
Sodium: 142 meq/L (ref 135–145)
Total Bilirubin: 0.5 mg/dL (ref 0.2–1.2)
Total Protein: 6.9 g/dL (ref 6.0–8.3)

## 2024-01-29 LAB — URINALYSIS, ROUTINE W REFLEX MICROSCOPIC
Bilirubin Urine: NEGATIVE
Ketones, ur: NEGATIVE
Nitrite: NEGATIVE
Specific Gravity, Urine: <=1.005 — AB (ref 1.000–1.030)
Total Protein, Urine: NEGATIVE
Urine Glucose: NEGATIVE
Urobilinogen, UA: 0.2 (ref 0.0–1.0)
pH: 6 (ref 5.0–8.0)

## 2024-01-29 LAB — CBC WITH DIFFERENTIAL/PLATELET
Basophils Absolute: 0 10*3/uL (ref 0.0–0.1)
Basophils Relative: 0.4 % (ref 0.0–3.0)
Eosinophils Absolute: 0.1 10*3/uL (ref 0.0–0.7)
Eosinophils Relative: 1.5 % (ref 0.0–5.0)
HCT: 40.8 % (ref 36.0–46.0)
Hemoglobin: 14.2 g/dL (ref 12.0–15.0)
Lymphocytes Relative: 20.8 % (ref 12.0–46.0)
Lymphs Abs: 1.4 10*3/uL (ref 0.7–4.0)
MCHC: 34.8 g/dL (ref 30.0–36.0)
MCV: 95 fL (ref 78.0–100.0)
Monocytes Absolute: 0.5 10*3/uL (ref 0.1–1.0)
Monocytes Relative: 7.7 % (ref 3.0–12.0)
Neutro Abs: 4.8 10*3/uL (ref 1.4–7.7)
Neutrophils Relative %: 69.6 % (ref 43.0–77.0)
Platelets: 159 10*3/uL (ref 150.0–400.0)
RBC: 4.3 Mil/uL (ref 3.87–5.11)
RDW: 12.9 % (ref 11.5–15.5)
WBC: 6.9 10*3/uL (ref 4.0–10.5)

## 2024-01-29 LAB — LIPID PANEL
Cholesterol: 200 mg/dL (ref 0–200)
HDL: 60.7 mg/dL (ref >39.00)
LDL Cholesterol: 113 mg/dL — ABNORMAL HIGH (ref 0–99)
NonHDL: 139.45
Total CHOL/HDL Ratio: 3
Triglycerides: 133 mg/dL (ref 0.0–149.0)
VLDL: 26.6 mg/dL (ref 0.0–40.0)

## 2024-01-29 LAB — TSH: TSH: 2.14 u[IU]/mL (ref 0.35–5.50)

## 2024-01-29 LAB — VITAMIN D 25 HYDROXY (VIT D DEFICIENCY, FRACTURES): VITD: 21.2 ng/mL — ABNORMAL LOW (ref 30.00–100.00)

## 2024-01-29 MED ORDER — ATORVASTATIN CALCIUM 40 MG PO TABS: 40.0000 mg | ORAL_TABLET | Freq: Every day | ORAL | 3 refills | Status: AC

## 2024-01-29 MED ORDER — NITROFURANTOIN MONOHYD MACRO 100 MG PO CAPS
100.0000 mg | ORAL_CAPSULE | Freq: Two times a day (BID) | ORAL | 0 refills | Status: DC
Start: 2024-01-29 — End: 2024-02-03

## 2024-01-29 MED ORDER — VALACYCLOVIR HCL 500 MG PO TABS: 500.0000 mg | ORAL_TABLET | Freq: Two times a day (BID) | ORAL | 1 refills | Status: AC | PRN

## 2024-01-29 NOTE — Patient Instructions (Addendum)
 Go to the lab on the way out.   If you have mychart we'll likely use that to update you.    Take care.  Glad to see you. PNA-20 shot when possible.  Please call neurology about your tremor if your labs are unremarkable.  Start macrobid  in the meantime.

## 2024-01-29 NOTE — Progress Notes (Signed)
 Elevated Cholesterol: Using medications without problems: no ADE on med but ran out of atorvastatin  a few weeks ago Muscle aches: yes Diet compliance: d/w pt.   Exercise: d/w pt, affected by tremor.    Mood, affected by tremor- she is conscious of her tremor in public.  No SI/HI.  Compliant with SSRI.    Tremor.  Taking 120mg  propranolol  BID and primidone  250mg  BID.  Still getting worse gradually.  Had neurology eval.  She is thought not to have Parkinson's, d/w pt.   Some days the tremor is worse than others.    Prev syncope d/w pt.  No recent syncope.  She didn't have to continue midodrine - she didn't have lower BP in the meantime.   She needed refill on valtrex , used prn.    H/o melanoma, had derm eval yearly.    Recent burning with urination.  Urgency.  No fevers.  This is atypical for patient.    Flu 2024 Tetanus 2021 PNA vaccine d/w pt.  Covid prev done.  Shingrix prev done per patient report, prev done at [facility].   Husband designated if patient were incapacitated.   Mammogram 2024 DXA 2022.  Has osteoporosis, defer tx until labs done and tremor addressed.   Colonoscopy 2021.    Meds, vitals, and allergies reviewed.   ROS: Per HPI unless specifically indicated in ROS section   GEN: nad, alert and oriented HEENT: ncat NECK: supple w/o LA CV: rrr PULM: ctab, no inc wob ABD: soft, +bs EXT: no edema SKIN: well perfused.  Hand tremor noted.

## 2024-01-31 ENCOUNTER — Encounter: Payer: Self-pay | Admitting: Family Medicine

## 2024-01-31 ENCOUNTER — Other Ambulatory Visit: Payer: Self-pay | Admitting: Family Medicine

## 2024-01-31 DIAGNOSIS — E559 Vitamin D deficiency, unspecified: Secondary | ICD-10-CM

## 2024-01-31 DIAGNOSIS — Z0000 Encounter for general adult medical examination without abnormal findings: Secondary | ICD-10-CM | POA: Insufficient documentation

## 2024-01-31 MED ORDER — VITAMIN D (ERGOCALCIFEROL) 1.25 MG (50000 UNIT) PO CAPS
50000.0000 [IU] | ORAL_CAPSULE | ORAL | 0 refills | Status: DC
Start: 2024-01-31 — End: 2024-04-18

## 2024-01-31 NOTE — Assessment & Plan Note (Signed)
Continue atorvastatin.  See notes on labs. 

## 2024-01-31 NOTE — Assessment & Plan Note (Signed)
 Asked patient to call neurology about tremor if her labs are unremarkable.

## 2024-01-31 NOTE — Assessment & Plan Note (Signed)
 H/o melanoma, had derm eval yearly.

## 2024-01-31 NOTE — Assessment & Plan Note (Signed)
 See notes on labs. Start macrobid  after collection.

## 2024-01-31 NOTE — Assessment & Plan Note (Signed)
 Flu 2024 Tetanus 2021 PNA vaccine d/w pt.  Covid prev done.  Shingrix prev done per patient report, prev done at [facility].   Husband designated if patient were incapacitated.   Mammogram 2024 DXA 2022.  Has osteoporosis, defer tx until labs done and tremor addressed.   Colonoscopy 2021.

## 2024-01-31 NOTE — Assessment & Plan Note (Signed)
 Continue citalopram.

## 2024-01-31 NOTE — Assessment & Plan Note (Signed)
Husband designated if patient were incapacitated. 

## 2024-01-31 NOTE — Assessment & Plan Note (Addendum)
 H/o but no recent events.  She didn't have to continue midodrine - she didn't have lower BP in the meantime.  See notes on labs.

## 2024-02-01 ENCOUNTER — Other Ambulatory Visit: Payer: Self-pay | Admitting: Family Medicine

## 2024-02-01 LAB — URINE CULTURE
MICRO NUMBER:: 16056889
SPECIMEN QUALITY:: ADEQUATE

## 2024-02-01 MED ORDER — CIPROFLOXACIN HCL 250 MG PO TABS: 250.0000 mg | ORAL_TABLET | Freq: Two times a day (BID) | ORAL | 0 refills | Status: AC

## 2024-04-11 ENCOUNTER — Other Ambulatory Visit: Payer: Self-pay | Admitting: Family Medicine

## 2024-04-11 DIAGNOSIS — Z1231 Encounter for screening mammogram for malignant neoplasm of breast: Secondary | ICD-10-CM

## 2024-04-12 ENCOUNTER — Ambulatory Visit
Admission: RE | Admit: 2024-04-12 | Discharge: 2024-04-12 | Disposition: A | Discharge status: Home / Self Care | Payer: Self-pay | Source: Ambulatory Visit | Attending: Family Medicine | Admitting: Family Medicine

## 2024-04-12 DIAGNOSIS — Z1231 Encounter for screening mammogram for malignant neoplasm of breast: Secondary | ICD-10-CM | POA: Insufficient documentation

## 2024-04-17 ENCOUNTER — Other Ambulatory Visit: Payer: Self-pay | Admitting: Family Medicine

## 2024-04-17 DIAGNOSIS — E559 Vitamin D deficiency, unspecified: Secondary | ICD-10-CM

## 2024-06-16 DIAGNOSIS — G250 Essential tremor: Secondary | ICD-10-CM | POA: Diagnosis not present

## 2024-06-22 DIAGNOSIS — D3132 Benign neoplasm of left choroid: Secondary | ICD-10-CM | POA: Diagnosis not present

## 2024-06-22 DIAGNOSIS — Z0100 Encounter for examination of eyes and vision without abnormal findings: Secondary | ICD-10-CM | POA: Diagnosis not present

## 2024-06-22 DIAGNOSIS — H2513 Age-related nuclear cataract, bilateral: Secondary | ICD-10-CM | POA: Diagnosis not present

## 2024-06-22 DIAGNOSIS — H04123 Dry eye syndrome of bilateral lacrimal glands: Secondary | ICD-10-CM | POA: Diagnosis not present

## 2024-06-29 ENCOUNTER — Other Ambulatory Visit: Payer: Self-pay | Admitting: Family Medicine

## 2024-06-29 DIAGNOSIS — E559 Vitamin D deficiency, unspecified: Secondary | ICD-10-CM

## 2024-07-19 DIAGNOSIS — L821 Other seborrheic keratosis: Secondary | ICD-10-CM | POA: Diagnosis not present

## 2024-07-19 DIAGNOSIS — Z1283 Encounter for screening for malignant neoplasm of skin: Secondary | ICD-10-CM | POA: Diagnosis not present

## 2024-07-19 DIAGNOSIS — L814 Other melanin hyperpigmentation: Secondary | ICD-10-CM | POA: Diagnosis not present

## 2024-07-19 DIAGNOSIS — D225 Melanocytic nevi of trunk: Secondary | ICD-10-CM | POA: Diagnosis not present

## 2024-07-19 DIAGNOSIS — D2270 Melanocytic nevi of unspecified lower limb, including hip: Secondary | ICD-10-CM | POA: Diagnosis not present

## 2024-07-19 DIAGNOSIS — D2260 Melanocytic nevi of unspecified upper limb, including shoulder: Secondary | ICD-10-CM | POA: Diagnosis not present

## 2024-07-19 DIAGNOSIS — L720 Epidermal cyst: Secondary | ICD-10-CM | POA: Diagnosis not present

## 2024-07-19 DIAGNOSIS — Z85820 Personal history of malignant melanoma of skin: Secondary | ICD-10-CM | POA: Diagnosis not present

## 2024-09-21 ENCOUNTER — Other Ambulatory Visit: Payer: Self-pay | Admitting: Family Medicine

## 2024-09-21 DIAGNOSIS — E559 Vitamin D deficiency, unspecified: Secondary | ICD-10-CM

## 2024-10-11 ENCOUNTER — Ambulatory Visit: Admitting: Podiatry

## 2024-10-11 DIAGNOSIS — M7751 Other enthesopathy of right foot: Secondary | ICD-10-CM | POA: Diagnosis not present

## 2024-10-11 NOTE — Progress Notes (Signed)
 Subjective:  Patient ID: Julie Lewis, female    DOB: 12-17-1956,  MRN: [id number]  Chief Complaint  Patient presents with   Foot Pain    Right foot pain     68 y.o. female presents with the above complaint.  Patient presents with right first metatarsophalangeal joint pain with underlying capsulitis hurts with ambulation is with pressure.  Denies any history of gout.  Pain scale 7 out of 10 dull aching nature would like to discuss treatment options for this.  He states it got really painful.  It came out of nowhere.  He has not seen anyone else prior to seeing me for this   Review of Systems: Negative except as noted in the HPI. Denies N/V/F/Ch.  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    Current Outpatient Medications:    atorvastatin  (LIPITOR) 40 MG tablet, Take 1 tablet (40 mg total) by mouth daily., Disp: 90 tablet, Rfl: 3   B Complex Vitamins (VITAMIN B COMPLEX PO), Take by mouth., Disp: , Rfl:    citalopram (CELEXA) 10 MG tablet, Take 20 mg by mouth daily., Disp: , Rfl:    gabapentin  (NEURONTIN ) 300 MG capsule, Take 600 mg by mouth 2 (two) times daily., Disp: , Rfl:    meloxicam  (MOBIC ) 7.5 MG tablet, Take 1 tablet (7.5 mg total) by mouth daily as needed for pain., Disp: , Rfl:    Multiple Vitamins-Minerals (MULTIVITAMIN GUMMIES ADULT PO), Take by mouth., Disp: , Rfl:    primidone  (MYSOLINE ) 250 MG tablet, Take 1 tablet (250 mg total) by mouth in the morning and at bedtime., Disp: , Rfl:    propranolol  (INDERAL ) 40 MG tablet, Take 40 mg by mouth. TAKE 3 TABLETS BY MOUTH EVERY MORNING , AND TAKE 2-3 TABLETS IN THE AFTERNOON OR EVENING., Disp: , Rfl:    Resveratrol 100 MG CAPS, Take 1 tablet by mouth every morning., Disp: ,  Rfl:    traMADol  (ULTRAM ) 50 MG tablet, Take 1 tablet (50 mg total) by mouth every 12 (twelve) hours as needed., Disp: , Rfl:    valACYclovir  (VALTREX ) 500 MG tablet, Take 1 tablet (500 mg total) by mouth 2 (two) times daily as needed., Disp: 180 tablet, Rfl: 1   Vitamin D , Ergocalciferol , (DRISDOL ) 1.25 MG (50000 UNIT) CAPS capsule, TAKE 1 CAPSULE (50,000 UNITS TOTAL) BY MOUTH EVERY 7 (SEVEN) DAYS, Disp: 12 capsule, Rfl: 0   zonisamide (ZONEGRAN) 25 MG capsule, Take 25 mg by mouth daily., Disp: , Rfl:   Social History   Tobacco Use  Smoking Status Never   Passive exposure: Past (as a child)  Smokeless Tobacco Never    Allergies  Allergen Reactions   Bupropion Other (See Comments)    myoclonus   Erythromycin     Severe stomach issues    Epinephrine Other (See Comments)    Chest pain. Intolerance to epinephrine   Iodinated Contrast Media Rash    Contrast dye   Penicillins Rash   Objective:  There were no vitals filed for this visit. There is no height or weight on file to calculate BMI. Constitutional Well developed. Well nourished.  Vascular Dorsalis pedis pulses palpable bilaterally. Posterior tibial pulses palpable bilaterally. Capillary refill normal to all digits.  No cyanosis or clubbing noted. Pedal hair growth normal.  Neurologic Normal speech. Oriented to person, place, and time. Epicritic sensation to light touch grossly present bilaterally.  Dermatologic Nails well groomed and normal in appearance. No open wounds. No skin lesions.  Orthopedic: Pain on palpation of right first metatarsophalangeal joint pain with range of motion of the joint mild swelling noted no redness noted.  No open wounds or lesion noted   Radiographs: None Assessment:   1. Capsulitis of metatarsophalangeal (MTP) joint of right foot    Plan:  Patient was evaluated and treated and all questions answered.  Right first metatarsophalangeal joint capsulitis - All questions and concerns  were discussed with the patient excessive due to given the amount of pain that he is having a benefit from steroid injection to help decrease inflammatory complaints and sharp pain.  Patient agrees with plan to proceed with steroid injection -A steroid injection was performed at right first metatarsophalangeal joint using 1% plain Lidocaine  and 10 mg of Kenalog. This was well tolerated.   No follow-ups on file.

## 2024-10-27 DIAGNOSIS — G250 Essential tremor: Secondary | ICD-10-CM | POA: Diagnosis not present

## 2024-11-08 ENCOUNTER — Ambulatory Visit: Admitting: Podiatry

## 2024-11-10 ENCOUNTER — Ambulatory Visit: Admitting: Podiatry

## 2024-11-10 DIAGNOSIS — M7672 Peroneal tendinitis, left leg: Secondary | ICD-10-CM
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
## 2025-01-12 ENCOUNTER — Encounter: Payer: Self-pay | Admitting: Family Medicine

## 2025-01-12 ENCOUNTER — Ambulatory Visit (INDEPENDENT_AMBULATORY_CARE_PROVIDER_SITE_OTHER): Payer: Medicare (Managed Care) | Admitting: Family Medicine

## 2025-01-12 VITALS — BP 120/78 | HR 55 | Temp 99.5°F | Ht 64.25 in | Wt 172.2 lb

## 2025-01-12 DIAGNOSIS — L02413 Cutaneous abscess of right upper limb: Secondary | ICD-10-CM | POA: Diagnosis not present

## 2025-01-12 DIAGNOSIS — L089 Local infection of the skin and subcutaneous tissue, unspecified: Secondary | ICD-10-CM | POA: Diagnosis not present

## 2025-01-12 DIAGNOSIS — L729 Follicular cyst of the skin and subcutaneous tissue, unspecified: Secondary | ICD-10-CM | POA: Diagnosis not present

## 2025-01-12 MED ORDER — DOXYCYCLINE HYCLATE 100 MG PO TABS: 100.0000 mg | ORAL_TABLET | Freq: Two times a day (BID) | ORAL | 0 refills | Status: AC

## 2025-01-12 NOTE — Progress Notes (Unsigned)
 "    Diamonds Lippard T. Darrion Wyszynski, MD, CAQ Sports Medicine [facility] [address] [place], [address]  Phone: [phone]  FAX: [phone]  Julie Lewis - 69 y.o. female  MRN [id number]  Date of Birth: [birth year]  Date: 01/12/2025  PCP: Cleatus Arlyss RAMAN, MD  Referral: Cleatus Arlyss RAMAN, MD  Chief Complaint  Patient presents with   Sore on Right Forearm    X 1 week   Subjective:   Julie Lewis is a 69 y.o. very pleasant female patient with Body mass index is 29.34 kg/m. who presents with the following:  Discussed the use of AI scribe software for clinical note transcription with the patient, who gave verbal consent to proceed.   History of Present Illness Julie Lewis is a 69 year old female who presents with a painful, warm, and enlarging lump on her right arm.  The lump on her right arm began about a week ago as a pea-sized, non-painful bump. Over the past few days, it has become painful, warm, and has increased in size. The redness and tenderness have worsened, particularly overnight. No trauma to the area and no initial bruising. The lump is described as hard initially.  She is allergic to penicillin and erythromycin, with erythromycin causing severe abdominal pain and previous hospitalizations.    Review of Systems is noted in the HPI, as appropriate  Objective:   BP 120/78   Pulse (!) 55   Temp 99.5 F (37.5 C) (Temporal)   Ht 5' 4.25 (1.632 m)   Wt 172 lb 4 oz (78.1 kg)   LMP 08/03/2020   SpO2 96%   BMI 29.34 kg/m   GEN: No acute distress; alert,appropriate. PULM: Breathing comfortably in no respiratory distress PSYCH: Normally interactive.     Well-circumscribed 1 cm mass that is in the soft tissue and easily mobile  The adjacent tissue is mildly warm and pink in appearance with mild tenderness  Laboratory and Imaging Data:  Assessment and Plan:     ICD-10-CM   1. Infected cyst of skin  L72.9     L08.9     2. Abscess of right arm  L02.413      Assessment & Plan Cutaneous abscess of right upper limb versus infected cyst Infected abscess on right upper limb, not drainable. Allergic to penicillin, intolerant to erythromycin. - Prescribed doxycycline  100 mg BID. - Apply warm compresses 8-10 times daily. - Monitor for increased redness or spreading.  Medication Management during today's office visit: Meds ordered this encounter  Medications   doxycycline  (VIBRA -TABS) 100 MG tablet    Sig: Take 1 tablet (100 mg total) by mouth 2 (two) times daily.    Dispense:  20 tablet    Refill:  0   Medications Discontinued During This Encounter  Medication Reason   zonisamide (ZONEGRAN) 25 MG capsule Dose change   citalopram (CELEXA) 10 MG tablet Dose change   primidone  (MYSOLINE ) 250 MG tablet Dose change    Orders placed today for conditions managed today: No orders of the defined types were placed in this encounter.   Disposition: No follow-ups on file.  [employer] speech-to-text software was used for transcription in this dictation.  Possible transcriptional errors can occur using Animal nutritionist.   Signed,  Jacques DASEN. Oletha Tolson, MD   Outpatient Encounter Medications as of 01/12/2025  Medication Sig   atorvastatin  (LIPITOR) 40 MG tablet Take 1 tablet (40 mg total) by mouth daily.  B Complex Vitamins (VITAMIN B COMPLEX PO) Take by mouth.   citalopram (CELEXA) 20 MG tablet Take 20 mg by mouth daily.   doxycycline  (VIBRA -TABS) 100 MG tablet Take 1 tablet (100 mg total) by mouth 2 (two) times daily.   gabapentin  (NEURONTIN ) 300 MG capsule Take 600 mg by mouth 2 (two) times daily.   meloxicam  (MOBIC ) 7.5 MG tablet Take 1 tablet (7.5 mg total) by mouth daily as needed for pain.   Multiple Vitamins-Minerals (MULTIVITAMIN GUMMIES ADULT PO) Take by mouth.   primidone  (MYSOLINE ) 250 MG tablet Take 250 mg by mouth 3 (three) times daily.   propranolol  (INDERAL ) 40 MG tablet Take  40 mg by mouth. TAKE 3 TABLETS BY MOUTH EVERY MORNING , AND TAKE 2-3 TABLETS IN THE AFTERNOON OR EVENING.   Resveratrol 100 MG CAPS Take 1 tablet by mouth every morning.   traMADol  (ULTRAM ) 50 MG tablet Take 1 tablet (50 mg total) by mouth every 12 (twelve) hours as needed.   valACYclovir  (VALTREX ) 500 MG tablet Take 1 tablet (500 mg total) by mouth 2 (two) times daily as needed.   Vitamin D , Ergocalciferol , (DRISDOL ) 1.25 MG (50000 UNIT) CAPS capsule TAKE 1 CAPSULE (50,000 UNITS TOTAL) BY MOUTH EVERY 7 (SEVEN) DAYS   zonisamide (ZONEGRAN) 50 MG capsule Take 50 mg by mouth daily.   [DISCONTINUED] primidone  (MYSOLINE ) 250 MG tablet Take 1 tablet (250 mg total) by mouth in the morning and at bedtime.   [DISCONTINUED] citalopram (CELEXA) 10 MG tablet Take 20 mg by mouth daily.   [DISCONTINUED] zonisamide (ZONEGRAN) 25 MG capsule Take 25 mg by mouth daily.   No facility-administered encounter medications on file as of 01/12/2025.   "
